# Patient Record
Sex: Male | Born: 1965
Health system: Southern US, Community
[De-identification: ages and names within clinical notes are randomized; demographics above are authoritative.]

## PROBLEM LIST (undated history)

## (undated) DIAGNOSIS — L259 Unspecified contact dermatitis, unspecified cause: Secondary | ICD-10-CM

## (undated) DIAGNOSIS — Z8719 Personal history of other diseases of the digestive system: Secondary | ICD-10-CM

## (undated) DIAGNOSIS — M25569 Pain in unspecified knee: Secondary | ICD-10-CM

## (undated) HISTORY — DX: Unspecified contact dermatitis, unspecified cause: L25.9

## (undated) HISTORY — PX: POLYPECTOMY: SHX149

## (undated) HISTORY — DX: Pain in unspecified knee: M25.569

## (undated) HISTORY — DX: Personal history of other diseases of the digestive system: Z87.19

---

## 1998-06-01 ENCOUNTER — Ambulatory Visit (HOSPITAL_BASED_OUTPATIENT_CLINIC_OR_DEPARTMENT_OTHER): Admission: RE | Admit: 1998-06-01 | Discharge: 1998-06-01 | Payer: Self-pay

## 2004-11-08 ENCOUNTER — Ambulatory Visit: Payer: Self-pay | Admitting: Internal Medicine

## 2004-11-12 ENCOUNTER — Ambulatory Visit: Payer: Self-pay

## 2005-11-17 ENCOUNTER — Ambulatory Visit: Payer: Self-pay | Admitting: Internal Medicine

## 2006-05-30 ENCOUNTER — Ambulatory Visit: Payer: Self-pay | Admitting: Internal Medicine

## 2006-11-21 HISTORY — PX: KNEE ARTHROSCOPY: SHX127

## 2007-01-10 ENCOUNTER — Ambulatory Visit: Payer: Self-pay | Admitting: Internal Medicine

## 2007-02-19 ENCOUNTER — Ambulatory Visit: Payer: Self-pay | Admitting: Internal Medicine

## 2007-11-24 ENCOUNTER — Encounter: Payer: Self-pay | Admitting: *Deleted

## 2007-11-24 DIAGNOSIS — Z87898 Personal history of other specified conditions: Secondary | ICD-10-CM

## 2007-11-24 DIAGNOSIS — L259 Unspecified contact dermatitis, unspecified cause: Secondary | ICD-10-CM

## 2007-11-24 DIAGNOSIS — M25569 Pain in unspecified knee: Secondary | ICD-10-CM

## 2008-06-12 ENCOUNTER — Ambulatory Visit: Payer: Self-pay | Admitting: Internal Medicine

## 2008-06-12 LAB — CONVERTED CEMR LAB
ALT: 47 units/L (ref 0–53)
BUN: 15 mg/dL (ref 6–23)
Basophils Relative: 0.3 % (ref 0.0–3.0)
Bilirubin, Direct: 0.1 mg/dL (ref 0.0–0.3)
CO2: 30 meq/L (ref 19–32)
Calcium: 9.1 mg/dL (ref 8.4–10.5)
Creatinine, Ser: 1.4 mg/dL (ref 0.4–1.5)
Eosinophils Relative: 6.8 % — ABNORMAL HIGH (ref 0.0–5.0)
Glucose, Bld: 107 mg/dL — ABNORMAL HIGH (ref 70–99)
Hemoglobin: 13 g/dL (ref 13.0–17.0)
Leukocytes, UA: NEGATIVE
Lymphocytes Relative: 54.8 % — ABNORMAL HIGH (ref 12.0–46.0)
Monocytes Relative: 8 % (ref 3.0–12.0)
Neutro Abs: 1.3 10*3/uL — ABNORMAL LOW (ref 1.4–7.7)
Nitrite: NEGATIVE
RBC: 5.29 M/uL (ref 4.22–5.81)
Specific Gravity, Urine: 1.025 (ref 1.000–1.03)
TSH: 1.34 microintl units/mL (ref 0.35–5.50)
Total CHOL/HDL Ratio: 3.5
Total Protein: 6.7 g/dL (ref 6.0–8.3)
Triglycerides: 85 mg/dL (ref 0–149)
pH: 6 (ref 5.0–8.0)

## 2008-06-20 ENCOUNTER — Ambulatory Visit: Payer: Self-pay | Admitting: Internal Medicine

## 2008-06-20 LAB — CONVERTED CEMR LAB: PSA: 1.15 ng/mL (ref 0.10–4.00)

## 2008-06-23 ENCOUNTER — Encounter: Payer: Self-pay | Admitting: Internal Medicine

## 2009-04-06 ENCOUNTER — Ambulatory Visit: Payer: Self-pay | Admitting: Internal Medicine

## 2009-04-06 ENCOUNTER — Encounter: Payer: Self-pay | Admitting: Internal Medicine

## 2009-04-06 DIAGNOSIS — S99919A Unspecified injury of unspecified ankle, initial encounter: Secondary | ICD-10-CM

## 2009-04-06 DIAGNOSIS — S99929A Unspecified injury of unspecified foot, initial encounter: Secondary | ICD-10-CM

## 2009-04-06 DIAGNOSIS — S8990XA Unspecified injury of unspecified lower leg, initial encounter: Secondary | ICD-10-CM | POA: Insufficient documentation

## 2009-04-08 ENCOUNTER — Telehealth: Payer: Self-pay | Admitting: Internal Medicine

## 2009-05-13 ENCOUNTER — Telehealth: Payer: Self-pay | Admitting: Internal Medicine

## 2009-12-16 ENCOUNTER — Telehealth: Payer: Self-pay | Admitting: Internal Medicine

## 2009-12-17 ENCOUNTER — Ambulatory Visit: Payer: Self-pay | Admitting: Internal Medicine

## 2010-03-15 ENCOUNTER — Ambulatory Visit: Payer: Self-pay | Admitting: Internal Medicine

## 2010-03-15 DIAGNOSIS — H903 Sensorineural hearing loss, bilateral: Secondary | ICD-10-CM

## 2010-03-15 DIAGNOSIS — N4889 Other specified disorders of penis: Secondary | ICD-10-CM

## 2010-06-10 ENCOUNTER — Ambulatory Visit: Payer: Self-pay | Admitting: Internal Medicine

## 2010-06-10 LAB — CONVERTED CEMR LAB
ALT: 38 units/L (ref 0–53)
AST: 37 units/L (ref 0–37)
Alkaline Phosphatase: 68 units/L (ref 39–117)
Bilirubin, Direct: 0.2 mg/dL (ref 0.0–0.3)
CO2: 31 meq/L (ref 19–32)
Calcium: 9.2 mg/dL (ref 8.4–10.5)
Eosinophils Relative: 6.3 % — ABNORMAL HIGH (ref 0.0–5.0)
Glucose, Bld: 93 mg/dL (ref 70–99)
HCT: 40.2 % (ref 39.0–52.0)
HDL: 41.1 mg/dL (ref >39.00)
Ketones, ur: NEGATIVE mg/dL
Lymphocytes Relative: 53.9 % — ABNORMAL HIGH (ref 12.0–46.0)
Monocytes Relative: 8.5 % (ref 3.0–12.0)
Neutrophils Relative %: 30.7 % — ABNORMAL LOW (ref 43.0–77.0)
Platelets: 224 10*3/uL (ref 150.0–400.0)
Sodium: 141 meq/L (ref 135–145)
Specific Gravity, Urine: 1.025 (ref 1.000–1.030)
Total Bilirubin: 1 mg/dL (ref 0.3–1.2)
Total CHOL/HDL Ratio: 4
Urine Glucose: NEGATIVE mg/dL
VLDL: 12.4 mg/dL (ref 0.0–40.0)
WBC: 4.6 10*3/uL (ref 4.5–10.5)
pH: 6 (ref 5.0–8.0)

## 2010-06-15 ENCOUNTER — Ambulatory Visit: Payer: Self-pay | Admitting: Internal Medicine

## 2010-06-15 LAB — CONVERTED CEMR LAB: Chlamydia, Swab/Urine, PCR: NEGATIVE

## 2010-12-21 NOTE — Assessment & Plan Note (Signed)
Summary: SEMEN IN URINE--STC   Vital Signs:  Patient profile:   45 year old male Height:      71 inches Weight:      203 pounds BMI:     28.42 O2 Sat:      96 % on Room air Temp:     98.0 degrees F oral Pulse rate:   77 / minute BP sitting:   138 / 92  (left arm) Cuff size:   large  Vitals Entered By: Bill Salinas CMA (December 17, 2009 4:49 PM)  O2 Flow:  Room air CC: pt here with c/o semen in his urine/ ab   Primary Care Provider:  Jacques Navy MD  CC:  pt here with c/o semen in his urine/ ab.  History of Present Illness: Patient presents with the c/o semen when he urinates. On questioning him he has had a stain on his shorts. He has had testicular pain. He denies any uretheral burning or discomfort. He has had unprotected intercourse but reports that he has had the same partner for a long time. He denies any fever, chills, back pain, abdominal pain.  Current Medications (verified): 1)  None  Allergies (verified): No Known Drug Allergies  Past History:  Past Medical History: Last updated: 06/20/2008 Hx of DERMATITIS (ICD-692.9) Hx of KNEE PAIN, RIGHT (ICD-719.46)-torn minsicus lateral HEMORRHOIDS, HX OF (ICD-V12.79)    Past Surgical History: Last updated: 06/20/2008 arthroscopy right knee torn lateral miniscus '08 (Dr. 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  Risk Factors: Exercise: yes (06/20/2008)  Risk Factors: Smoking Status: never (11/24/2007)  Review of Systems  The patient denies anorexia, weight loss, hoarseness, chest pain, dyspnea on exertion, headaches, abdominal pain, incontinence, genital  sores, suspicious skin lesions, difficulty walking, abnormal bleeding, and enlarged lymph nodes.    Physical Exam  General:  Very fit appearing AA male who looks younger than his stated age Head:  Normocephalic and atraumatic without obvious abnormalities. No apparent alopecia or balding. Neck:  supple.   Lungs:  Normal respiratory effort, chest expands symmetrically. Lungs are clear to auscultation, no crackles or wheezes. Heart:  normal rate and regular rhythm.   Skin:  turgor normal, color normal, and no rashes.   Psych:  Oriented X3, memory intact for recent and remote, normally interactive, and good eye contact.     Impression & Recommendations:  Problem # 1:  PENILE DISCHARGE (ICD-788.7) Patient with a penile discharge - appears to be semen by his report. He has been having unprotected intercourse. Discussed my concern for possible STD/GC.  Plan azithromycin 2 g single dose.  Complete Medication List: 1)  Azithromycin 500 Mg Tabs (Azithromycin) .... 4 tabs taken as single dose Prescriptions: AZITHROMYCIN 500 MG TABS (AZITHROMYCIN) 4 tabs taken as single dose  #4 x 0   Entered and Authorized by:   Jacques Navy MD   Signed by:   Jacques Navy MD on 12/21/2009   Method used:   Electronically to        [facility]* (retail)       [address]       [place], [place]  [id number]  Ph: [phone]       Fax: [phone]   RxID:   [id number]

## 2010-12-21 NOTE — Assessment & Plan Note (Signed)
Summary: PHYSICAL--STC   Vital Signs:  Patient profile:   45 year old male Height:      71 inches Weight:      198 pounds BMI:     27.72 O2 Sat:      96 % on Room air Temp:     98.5 degrees F oral Pulse rate:   64 / minute BP sitting:   118 / 78  (left arm) Cuff size:   large  Vitals Entered By: Bill Salinas CMA (June 15, 2010 1:23 PM)  O2 Flow:  Room air  Primary Care Provider:  Jacques Navy MD   History of Present Illness: Patient presents for routine medical exam. He is feeling good. he has been healthy. Has diagnosed with flat warts of the face. He is using Terrasil- a non-prescription cream off the internet for treatment of flat warts. Could not determine the active ingredients. He is tolerating this well.   Current Medications (verified): 1)  None  Allergies (verified): No Known Drug Allergies  Past History:  Past Medical History: Last updated: 06/20/2008 Hx of DERMATITIS (ICD-692.9) Hx of KNEE PAIN, RIGHT (ICD-719.46)-torn minsicus lateral HEMORRHOIDS, HX OF (ICD-V12.79)    Past Surgical History: Last updated: 06/20/2008 arthroscopy right knee torn lateral miniscus '08 (Dr. 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  Risk Factors: Smoking Status: never (11/24/2007)  Review of Systems  The patient denies anorexia, fever, weight loss, weight gain, vision loss, decreased hearing, hoarseness, chest pain, syncope, dyspnea on exertion, peripheral edema, prolonged cough, headaches, abdominal pain, melena, hematuria, suspicious skin lesions, transient blindness, difficulty walking, abnormal bleeding, enlarged  lymph nodes, and angioedema.    Physical Exam  General:  Well nourished athletic appearing AA male looking younger than his stated age.  Head:  Normocephalic and atraumatic without obvious abnormalities. No apparent alopecia or balding. Eyes:  No corneal or conjunctival inflammation noted. EOMI. Perrla. Funduscopic exam benign, without hemorrhages, exudates or papilledema. Vision grossly normal. Ears:  External ear exam shows no significant lesions or deformities.  Otoscopic examination reveals clear canals, tympanic membranes are intact bilaterally without bulging, retraction, inflammation or discharge. Hearing is grossly normal bilaterally. Nose:  no external deformity and no external erythema.   Mouth:  Oral mucosa and oropharynx without lesions or exudates.  Teeth in good repair. Neck:  supple, no masses, no thyromegaly, and no carotid bruits.   Chest Wall:  No deformities, masses, tenderness or gynecomastia noted. Lungs:  Normal respiratory effort, chest expands symmetrically. Lungs are clear to auscultation, no crackles or wheezes. Heart:  Normal rate and regular rhythm. S1 and S2 normal without gallop, murmur, click, rub or other extra sounds. Abdomen:  Bowel sounds positive,abdomen soft and non-tender without masses, organomegaly or hernias noted. Rectal:  No external abnormalities noted. Normal sphincter tone. No rectal masses or tenderness. Genitalia:  Testes bilaterally descended without nodularity, tenderness or masses. No scrotal masses or lesions. No penis lesions or urethral discharge. Prostate:  Prostate gland firm and smooth, no enlargement, nodularity, tenderness, mass, asymmetry or induration. Msk:  normal ROM, no joint tenderness, no joint swelling, no joint warmth, no redness over joints, and no joint instability.   Pulses:  2+ radial pulse  Extremities:  No clubbing, cyanosis, edema, or deformity noted with normal full range of motion of all joints.   Neurologic:  alert &  oriented X3, cranial nerves II-XII intact, strength normal in all extremities, gait normal, and DTRs symmetrical and normal.   Skin:  turgor normal, color normal, no suspicious lesions, no purpura, and no ulcerations.  Feint depigmentation forehead. Flat, pale lesions on the cheeks Cervical Nodes:  no anterior cervical adenopathy and no posterior cervical adenopathy.   Axillary Nodes:  no R axillary adenopathy and no L axillary adenopathy.   Psych:  Oriented X3, memory intact for recent and remote, normally interactive, and good eye contact.     Impression & Recommendations:  Problem # 1:  Hx of DERMATITIS (ICD-692.9) treating for flat warts-face with Terrasil - nonprescription medication for warts. Seems to be working   Problem # 2:  HEMORRHOIDS, HX OF (ICD-V12.79) no active hemorrhoids.  Problem # 3:  Preventive Health Care (ICD-V70.0) Normal history. Penile pain with erections has resolved. Normal physical exam. Lab results are all in normal range. He is for general STD screening at his request: GC, chlamydia, syphyllis and HIV. PSA is normal. Immunizations are current.   In summary - a very nice man who works hard and takes the time to invest in his health. He is medically stable. He will return in 18-24 months for routine exam.   Other Orders: T-GC Probe, urine [phone]) T-RPR (Syphilis) [phone]) T-Chlamydia  Probe, urine [phone]) T-HIV Antibody  (Reflex) ([id number])  Patient: Earley Abide Note: All result statuses are Final unless otherwise noted.  Tests: (1) BMP (METABOL)   Sodium                    141 mEq/L                   135-145   Potassium                 4.6 mEq/L                   3.5-5.1   Chloride                  105 mEq/L                   96-112   Carbon Dioxide            31 mEq/L                    19-32   Glucose                   93 mg/dL                    75-10   BUN                       19 mg/dL                    2-58   Creatinine                 1.4 mg/dL                   5.2-7.7   Calcium                   9.2 mg/dL  8.4-10.5   GFR                       73.48 mL/min                >60  Tests: (2) Lipid Panel (LIPID)   Cholesterol               155 mg/dL                   1-610     ATP III Classification            Desirable:  < 200 mg/dL                    Borderline High:  200 - 239 mg/dL               High:  > = 240 mg/dL   Triglycerides             62.0 mg/dL                  9.6-045.4     Normal:  <150 mg/dL     Borderline High:  098 - 199 mg/dL   HDL                       11.91 mg/dL                 >47.82   VLDL Cholesterol          12.4 mg/dL                  9.5-62.1   LDL Cholesterol      [H]  308 mg/dL                   6-57  CHO/HDL Ratio:  CHD Risk                             4                    Men          Women     1/2 Average Risk     3.4          3.3     Average Risk          5.0          4.4     2X Average Risk          9.6          7.1     3X Average Risk          15.0          11.0                           Tests: (3) CBC Platelet w/Diff (CBCD)   White Cell Count          4.6 K/uL                    4.5-10.5   Red Cell Count            5.28 Mil/uL                 4.22-5.81   Hemoglobin  13.0 g/dL                   57.8-46.9   Hematocrit                40.2 %                      39.0-52.0   MCV                  [L]  76.3 fl                     78.0-100.0   MCHC                      32.3 g/dL                   62.9-52.8   RDW                       14.2 %                      11.5-14.6   Platelet Count            224.0 K/uL                  150.0-400.0   Neutrophil %         [L]  30.7 %                      43.0-77.0   Lymphocyte %         [H]  53.9 %                      12.0-46.0   Monocyte %                8.5 %                       3.0-12.0   Eosinophils%         [H]  6.3 %                       0.0-5.0   Basophils %               0.6 %                        0.0-3.0   Neutrophill Absolute      1.4 K/uL                    1.4-7.7   Lymphocyte Absolute       2.5 K/uL                    0.7-4.0   Monocyte Absolute         0.4 K/uL                    0.1-1.0  Eosinophils, Absolute                             0.3 K/uL                    0.0-0.7   Basophils Absolute  0.0 K/uL                    0.0-0.1  Tests: (4) Hepatic/Liver Function Panel (HEPATIC)   Total Bilirubin           1.0 mg/dL                   8.8-4.1   Direct Bilirubin          0.2 mg/dL                   6.6-0.6   Alkaline Phosphatase      68 U/L                      39-117   AST                       37 U/L                      0-37   ALT                       38 U/L                      0-53   Total Protein             7.1 g/dL                    3.0-1.6   Albumin                   4.1 g/dL                    0.1-0.9  Tests: (5) TSH (TSH)   FastTSH                   1.22 uIU/mL                 0.35-5.50  Tests: (6) Prostate Specific Antigen (PSA)   PSA-Hyb                   1.67 ng/mL                  0.10-4.00  Tests: (7) UDip Only (UDIP)   Color                     LT. YELLOW       RANGE:  Yellow;Lt. Yellow   Clarity                   CLEAR                       Clear   Specific Gravity          1.025                       1.000 - 1.030   Urine Ph                  6.0                         5.0-8.0   Protein                   NEGATIVE  Negative   Urine Glucose             NEGATIVE                    Negative   Ketones                   NEGATIVE                    Negative   Urine Bilirubin           NEGATIVE                    Negative   Blood                     NEGATIVE                    Negative   Urobilinogen              1.0                         0.0 - 1.0   Leukocyte Esterace        NEGATIVE                    Negative   Nitrite                   NEGATIVE                    Negative  (1) HIV 1,2 Antibody, with Reflex ([id number])    HIV Antibody              NON REAC                    NON REAC  Tests: (2) RPR Reflex to T.pallidum Ab, Total ([id number])   RPR                       NON REAC                    NON REAC  Tests: (3) Chlamydia Probe Amp,Urine ([id number])  Chlamydia Probe Amp, Ur                             <No Reported Value>         NEGATIVE  Tests: (4) GC Probe Amp, Urine ([id number])   GC Probe Amp, Urine       <No Reported Value>         NEGATIVE

## 2010-12-21 NOTE — Progress Notes (Signed)
Summary: urine issue  Phone Note Call from Patient Call back at Work Phone [phone]   Summary of Call: Patient left message on triage the he would like a call regarding his urine. I spoke with patient and he described himself as "very sexually active".  He is concerned b/c yesterday when the patient tried to urinate semen came out. Patient denies and signs of infection such as sores, bleeding,discharge, and painful/burning urination. Patient also denies penis pain/swelling, but did note that his testicles are more sensitive/a little painful. (Call patient on cell above--he works 3rd shift.) Please adivse. Initial call taken by: Lucious Groves,  December 16, 2009 1:55 PM  Follow-up for Phone Call        Does not sound like a problem that needs any attention as long as there is no urinary retention, hematuria or pain. Follow-up by: Jacques Navy MD,  December 16, 2009 5:35 PM  Additional Follow-up for Phone Call Additional follow up Details #1::        Pt informed, scheduled for office visit today Additional Follow-up by: Lamar Sprinkles, CMA,  December 17, 2009 3:56 PM

## 2010-12-21 NOTE — Assessment & Plan Note (Signed)
Summary: ERECTION PAIN---STC   Vital Signs:  Patient profile:   45 year old male Height:      71 inches Weight:      197 pounds BMI:     27.58 O2 Sat:      97 % on Room air Temp:     98.0 degrees F oral Pulse rate:   58 / minute BP sitting:   122 / 72  (left arm) Cuff size:   large  Vitals Entered By: Bill Salinas CMA (March 15, 2010 9:53 AM)  O2 Flow:  Room air CC: pt here with complaint of pain on erection/ ab   Primary Care Provider:  Jacques Navy MD  CC:  pt here with complaint of pain on erection/ ab.  History of Present Illness: Patient presents c/o a mild discomfort affecting the glans penis with erections. He is able to attain and sustain a normal erection to completion of intercourse. He has not noticed any penile deformity with erection. He has had no uretheral d/c, pain with intercourse or with ejactulation, no perineal, low back or groin pain.  He has had high frequency hearing loss as tested at work. He would like an ear exam today.   Current Medications (verified): 1)  None  Allergies (verified): No Known Drug Allergies  Past History:  Past Medical History: Last updated: 06/20/2008 Hx of DERMATITIS (ICD-692.9) Hx of KNEE PAIN, RIGHT (ICD-719.46)-torn minsicus lateral HEMORRHOIDS, HX OF (ICD-V12.79)    Past Surgical History: Last updated: 06/20/2008 arthroscopy right knee torn lateral miniscus '08 (Dr. Thurston Hole) [facility] reviewed for relevance, FH reviewed for relevance  Review of Systems  The patient denies anorexia, fever, weight loss, weight gain, decreased hearing, chest pain, syncope, dyspnea on exertion, prolonged cough, hemoptysis, abdominal pain, hematochezia, hematuria, genital sores, difficulty walking, enlarged lymph nodes, and testicular masses.    Physical Exam  General:  WNWD AA male - appears atheletic Ears:  External ear exam shows no significant lesions or deformities.  Otoscopic examination reveals clear canals, tympanic membranes  are intact bilaterally without bulging, retraction, inflammation or discharge. Hearing is grossly normal bilaterally. Genitalia:  circumcised, no hydrocele, no varicocele, and no scrotal masses.  No abanormality of the glans penis, urethra or penile shaft.  Psych:  Oriented X3, normally interactive, and good eye contact.     Impression & Recommendations:  Problem # 1:  PENILE PAIN (ICD-607.89) Patient with mild discomfort which he describes as a tightness at the glans penis with erection but not frank pain. this will abate with intercourse. History is otherwise unremarkable.  Impression - normal exam.May have very symptoms of Peyronie's disease.  Plan - provided "UpToDate" chapt on Peyronie's disease           call if symptoms get worse.  Problem # 2:  SENSORINEURAL HEARING LOSS BILATERAL (ICD-389.18) EACs and TMs normal. He has high frequency hearing loss by audiometry at work.  Plan - he may want to get repeat audiology evaluation and consultation from ENT.

## 2011-04-08 NOTE — Letter (Signed)
January 10, 2007    Dr. Pati Gallo  [facility]  [address]  [place], [place]. [address]   RE:  William Jacobson, William Jacobson  MRN:  [id number]  /  DOB:  12/19/1965   Dear Dr. Farris Has,   Thank you very much for seeing William Jacobson, a very pleasant 45-  year-old African-American gentleman, who has a problem with stiffness in  his right knee.   William Jacobson has had problems with stiffness in his right knee and  decreased range of motion, particularly in regards to flexion, for at  least two years.  In December of 2005, I did see him for this problem.  I obtained lower extremity venous Dopplers, which were negative for  Baker's cyst or any other vascular abnormality.  The patient continues  to have a problem with some stiffness in his knee, mild discomfort.  He  has limitation in flexion, but really no limitation in his daily  activities.   On examination, the knee appears normal to me.  There is no crepitus.  There is no lock or click.  He has a negative drawer sign.   I do not have any radiographic studies on this patient.   PAST MEDICAL HISTORY:  Significant for the usual childhood diseases,  hemorrhoids, knee pain and no other medical problems.  He takes no  prescription medications at this time.   I appreciate your assistance in evaluating this nice gentleman.  If I  can provide any additional information, please do not hesitate to  contact me.   I look forward to hearing from you and I remain    Sincerely,      William Gess. Norins, MD  Electronically Signed    MEN/MedQ  DD: 01/10/2007  DT: 01/10/2007  Job #: [id number]

## 2011-04-08 NOTE — Assessment & Plan Note (Signed)
[facility]                           PRIMARY CARE OFFICE NOTE   William Jacobson, William Jacobson                   MRN:          [id number]  DATE:02/19/2007                            DOB:          05/16/1966    Mr. William Jacobson presents today for removal of a small skin tag from the  suprapubic region.  After informed verbal and written consent was  obtained the area was prepped with alcohol, anesthetized with Lidocaine  with 1% epinephrine.  The area was re-prepped with Betadine.   Using sharp curved scissors the skin tag was removed without difficulty.  The resulting wound was hyfrecated with a setting of 10 with good  control of bleeding.   Skin tag removal, the patient tolerated this well.  He was given routine  wound precautions.  He will return to see me on a p.r.n. basis.     Rosalyn Gess Norins, MD  Electronically Signed    MEN/MedQ  DD: 02/20/2007  DT: 02/20/2007  Job #: [id number]

## 2011-07-15 ENCOUNTER — Other Ambulatory Visit: Payer: Self-pay

## 2011-07-22 ENCOUNTER — Encounter: Payer: Self-pay | Admitting: Internal Medicine

## 2012-04-03 ENCOUNTER — Telehealth: Payer: Self-pay | Admitting: *Deleted

## 2012-04-03 NOTE — Telephone Encounter (Signed)
NOTIFIED [employer] of need for patient to have ov with Dr. Debby Bud before completing form for Rx. Forms faxed back to [employer] as requested.  sue

## 2012-05-14 ENCOUNTER — Telehealth: Payer: Self-pay | Admitting: Internal Medicine

## 2012-05-14 NOTE — Telephone Encounter (Signed)
The patient called and is concerned he has a sexually transmitted disease.  He is hoping to get worked in with you this week. Do you want him added?    Thanks!

## 2012-05-14 NOTE — Telephone Encounter (Signed)
OK to add on ASAP

## 2012-05-15 ENCOUNTER — Encounter: Payer: Self-pay | Admitting: Internal Medicine

## 2012-05-15 ENCOUNTER — Ambulatory Visit (INDEPENDENT_AMBULATORY_CARE_PROVIDER_SITE_OTHER): Payer: BC Managed Care – PPO | Admitting: Internal Medicine

## 2012-05-15 VITALS — BP 120/72 | HR 61 | Temp 97.7°F | Resp 16 | Wt 202.0 lb

## 2012-05-15 DIAGNOSIS — Z202 Contact with and (suspected) exposure to infections with a predominantly sexual mode of transmission: Secondary | ICD-10-CM

## 2012-05-15 DIAGNOSIS — Z2089 Contact with and (suspected) exposure to other communicable diseases: Secondary | ICD-10-CM

## 2012-05-15 MED ORDER — METRONIDAZOLE 500 MG PO TABS: 500.0000 mg | ORAL_TABLET | Freq: Three times a day (TID) | ORAL | Status: AC

## 2012-05-15 NOTE — Patient Instructions (Addendum)
Sounds like trichomonas vaginalis - a common infection that is sexually transmitted usually between partners in a cycle of reinfection. Men are generally asymptomatic.  Plan - metronidazole 500 mg 4 tablets at one time and done.   Trichomoniasis Trichomoniasis is an infection, caused by the Trichomonas organism, that affects both women and men. In women, the outer male genitalia and the vagina are affected. In men, the penis is mainly affected, but the prostate and other reproductive organs can also be involved. Trichomoniasis is a sexually transmitted disease (STD) and is most often passed to another person through sexual contact. The majority of people who get trichomoniasis do so from a sexual encounter and are also at risk for other STDs. CAUSES    Sexual intercourse with an infected partner.   It can be present in swimming pools or hot tubs.  SYMPTOMS    Abnormal gray-green frothy vaginal discharge in women.   Vaginal itching and irritation in women.   Itching and irritation of the area outside the vagina in women.   Penile discharge with or without pain in males.   Inflammation of the urethra (urethritis), causing painful urination.   Bleeding after sexual intercourse.  RELATED COMPLICATIONS  Pelvic inflammatory disease.   Infection of the uterus (endometritis).   Infertility.   Tubal (ectopic) pregnancy.   It can be associated with other STDs, including gonorrhea and chlamydia, hepatitis B, and HIV.  COMPLICATIONS DURING PREGNANCY  Early (premature) delivery.   Premature rupture of the membranes (PROM).   Low birth weight.  DIAGNOSIS    Visualization of Trichomonas under the microscope from the vagina discharge.   Ph of the vagina greater than 4.5, tested with a test tape.   Trich Rapid Test.   Culture of the organism, but this is not usually needed.   It may be found on a Pap test.   Having a "strawberry cervix,"which means the cervix looks very red  like a strawberry.  TREATMENT    You may be given medication to fight the infection. Inform your caregiver if you could be or are pregnant. Some medications used to treat the infection should not be taken during pregnancy.   Over-the-counter medications or creams to decrease itching or irritation may be recommended.   Your sexual partner will need to be treated if infected.  HOME CARE INSTRUCTIONS    Take all medication prescribed by your caregiver.   Take over-the-counter medication for itching or irritation as directed by your caregiver.   Do not have sexual intercourse while you have the infection.   Do not douche or wear tampons.   Discuss your infection with your partner, as your partner may have acquired the infection from you. Or, your partner may have been the person who transmitted the infection to you.   Have your sex partner examined and treated if necessary.   Practice safe, informed, and protected sex.   See your caregiver for other STD testing.  SEEK MEDICAL CARE IF:    You still have symptoms after you finish the medication.   You have an oral temperature above 102 F (38.9 C).   You develop belly (abdominal) pain.   You have pain when you urinate.   You have bleeding after sexual intercourse.   You develop a rash.   The medication makes you sick or makes you throw up (vomit).  Document Released: 05/03/2001 Document Revised: 10/27/2011 Document Reviewed: 05/29/2009 [facility] Patient Information 2012 [place], [place].

## 2012-05-15 NOTE — Telephone Encounter (Signed)
lvmom on home & cell number.

## 2012-05-15 NOTE — Progress Notes (Signed)
  Subjective:    Patient ID: William Jacobson, male    DOB: 18-Apr-1966, 46 y.o.   MRN: [id number]  HPI William Jacobson presents due to his partner being diagnosed with what sound like trichmonas. He is asymptomatic. He reports that he is monogamous  He has no active medical complaints. He takes good care of himself physically.  Past Medical History  Diagnosis Date  . Contact dermatitis and other eczema, due to unspecified cause   . Pain in joint, lower leg     pain in right knee- torn minisicus lateral  . Personal history of other diseases of digestive system     history of hemorrhoids   Past Surgical History  Procedure Date  . Knee arthroscopy 2008    arthroscopy right knee torn laterlal  miniscus   Family History  Problem Relation Age of Onset  . Diabetes Mother    History   Social History  . Marital Status: Married    Spouse Name: N/A    Number of Children: N/A  . Years of Education: N/A   Occupational History  . Not on file.   Social History Main Topics  . Smoking status: Not on file  . Smokeless tobacco: Not on file  . Alcohol Use:   . Drug Use:   . Sexually Active:    Other Topics Concern  . 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     Objective:   Physical Exam Filed Vitals:   05/15/12 1144  BP: 120/72  Pulse: 61  Temp: 97.7 F (36.5 C)  Resp: 16   Gen'l- very athletic appearing AA man in no distress Cor- 2+ radial Pulm - normal  Neuro - A&O x 3       Assessment & Plan:  Trichomonas - based on history he relates.  Plan - Metronidazole 2 g x 1 dose  Health maintenance -  recommend CPX 2014

## 2015-06-12 ENCOUNTER — Telehealth: Payer: Self-pay | Admitting: General Practice

## 2015-06-12 NOTE — Telephone Encounter (Signed)
Patient is a previous [facility] patient.  He is requesting to transfer to [place].  His new wife will need a provider as well and he is requesting for her to establish as well.  Please advise.

## 2015-06-14 NOTE — Telephone Encounter (Signed)
Ok with me 

## 2015-06-15 NOTE — Telephone Encounter (Signed)
Notified patient.  Patient will call back to schedule.

## 2015-08-06 ENCOUNTER — Ambulatory Visit (INDEPENDENT_AMBULATORY_CARE_PROVIDER_SITE_OTHER): Payer: BLUE CROSS/BLUE SHIELD | Admitting: Family

## 2015-08-06 ENCOUNTER — Encounter: Payer: Self-pay | Admitting: Family

## 2015-08-06 VITALS — BP 112/86 | HR 76 | Temp 98.6°F | Resp 18 | Ht 71.0 in | Wt 204.0 lb

## 2015-08-06 DIAGNOSIS — Z0000 Encounter for general adult medical examination without abnormal findings: Secondary | ICD-10-CM

## 2015-08-06 DIAGNOSIS — Z0001 Encounter for general adult medical examination with abnormal findings: Secondary | ICD-10-CM

## 2015-08-06 MED ORDER — SELENIUM SULFIDE 2.5 % EX LOTN: 1.0000 "application " | TOPICAL_LOTION | Freq: Every day | CUTANEOUS | Status: DC | PRN

## 2015-08-06 NOTE — Progress Notes (Signed)
Pre visit review using our clinic review tool, if applicable. No additional management support is needed unless otherwise documented below in the visit note. 

## 2015-08-06 NOTE — Patient Instructions (Signed)
Thank you for choosing [facility].  Summary/Instructions:  Your prescription(s) have been submitted to your pharmacy or been printed and provided for you. Please take as directed and contact our office if you believe you are having problem(s) with the medication(s) or have any questions.  Please stop by the lab on the basement level of the building for your blood work. Your results will be released to MyChart (or called to you) after review, usually within 72 hours after test completion. If any changes need to be made, you will be notified at that same time.   Health Maintenance A healthy lifestyle and preventative care can promote health and wellness.  Maintain regular health, dental, and eye exams.  Eat a healthy diet. Foods like vegetables, fruits, whole grains, low-fat dairy products, and lean protein foods contain the nutrients you need and are low in calories. Decrease your intake of foods high in solid fats, added sugars, and salt. Get information about a proper diet from your health care provider, if necessary.  Regular physical exercise is one of the most important things you can do for your health. Most adults should get at least 150 minutes of moderate-intensity exercise (any activity that increases your heart rate and causes you to sweat) each week. In addition, most adults need muscle-strengthening exercises on 2 or more days a week.   Maintain a healthy weight. The body mass index (BMI) is a screening tool to identify possible weight problems. It provides an estimate of body fat based on height and weight. Your health care provider can find your BMI and can help you achieve or maintain a healthy weight. For males 20 years and older:  A BMI below 18.5 is considered underweight.  A BMI of 18.5 to 24.9 is normal.  A BMI of 25 to 29.9 is considered overweight.  A BMI of 30 and above is considered obese.  Maintain normal blood lipids and cholesterol by exercising and  minimizing your intake of saturated fat. Eat a balanced diet with plenty of fruits and vegetables. Blood tests for lipids and cholesterol should begin at age 20 and be repeated every 5 years. If your lipid or cholesterol levels are high, you are over age 50, or you are at high risk for heart disease, you may need your cholesterol levels checked more frequently.Ongoing high lipid and cholesterol levels should be treated with medicines if diet and exercise are not working.  If you smoke, find out from your health care provider how to quit. If you do not use tobacco, do not start.  Lung cancer screening is recommended for adults aged 55-80 years who are at high risk for developing lung cancer because of a history of smoking. A yearly low-dose CT scan of the lungs is recommended for people who have at least a 30-pack-year history of smoking and are current smokers or have quit within the past 15 years. A pack year of smoking is smoking an average of 1 pack of cigarettes a day for 1 year (for example, a 30-pack-year history of smoking could mean smoking 1 pack a day for 30 years or 2 packs a day for 15 years). Yearly screening should continue until the smoker has stopped smoking for at least 15 years. Yearly screening should be stopped for people who develop a health problem that would prevent them from having lung cancer treatment.  If you choose to drink alcohol, do not have more than 2 drinks per day. One drink is considered to be   12 oz (360 mL) of beer, 5 oz (150 mL) of wine, or 1.5 oz (45 mL) of liquor.  Avoid the use of street drugs. Do not share needles with anyone. Ask for help if you need support or instructions about stopping the use of drugs.  High blood pressure causes heart disease and increases the risk of stroke. Blood pressure should be checked at least every 1-2 years. Ongoing high blood pressure should be treated with medicines if weight loss and exercise are not effective.  If you are  45-79 years old, ask your health care provider if you should take aspirin to prevent heart disease.  Diabetes screening involves taking a blood sample to check your fasting blood sugar level. This should be done once every 3 years after age 45 if you are at a normal weight and without risk factors for diabetes. Testing should be considered at a younger age or be carried out more frequently if you are overweight and have at least 1 risk factor for diabetes.  Colorectal cancer can be detected and often prevented. Most routine colorectal cancer screening begins at the age of 50 and continues through age 75. However, your health care provider may recommend screening at an earlier age if you have risk factors for colon cancer. On a yearly basis, your health care provider may provide home test kits to check for hidden blood in the stool. A small camera at the end of a tube may be used to directly examine the colon (sigmoidoscopy or colonoscopy) to detect the earliest forms of colorectal cancer. Talk to your health care provider about this at age 50 when routine screening begins. A direct exam of the colon should be repeated every 5-10 years through age 75, unless early forms of precancerous polyps or small growths are found.  People who are at an increased risk for hepatitis B should be screened for this virus. You are considered at high risk for hepatitis B if:  You were born in a country where hepatitis B occurs often. Talk with your health care provider about which countries are considered high risk.  Your parents were born in a high-risk country and you have not received a shot to protect against hepatitis B (hepatitis B vaccine).  You have HIV or AIDS.  You use needles to inject street drugs.  You live with, or have sex with, someone who has hepatitis B.  You are a man who has sex with other men (MSM).  You get hemodialysis treatment.  You take certain medicines for conditions like cancer, organ  transplantation, and autoimmune conditions.  Hepatitis C blood testing is recommended for all people born from 1945 through 1965 and any individual with known risk factors for hepatitis C.  Healthy men should no longer receive prostate-specific antigen (PSA) blood tests as part of routine cancer screening. Talk to your health care provider about prostate cancer screening.  Testicular cancer screening is not recommended for adolescents or adult males who have no symptoms. Screening includes self-exam, a health care provider exam, and other screening tests. Consult with your health care provider about any symptoms you have or any concerns you have about testicular cancer.  Practice safe sex. Use condoms and avoid high-risk sexual practices to reduce the spread of sexually transmitted infections (STIs).  You should be screened for STIs, including gonorrhea and chlamydia if:  You are sexually active and are younger than 24 years.  You are older than 24 years, and your health care provider   tells you that you are at risk for this type of infection.  Your sexual activity has changed since you were last screened, and you are at an increased risk for chlamydia or gonorrhea. Ask your health care provider if you are at risk.  If you are at risk of being infected with HIV, it is recommended that you take a prescription medicine daily to prevent HIV infection. This is called pre-exposure prophylaxis (PrEP). You are considered at risk if:  You are a man who has sex with other men (MSM).  You are a heterosexual man who is sexually active with multiple partners.  You take drugs by injection.  You are sexually active with a partner who has HIV.  Talk with your health care provider about whether you are at high risk of being infected with HIV. If you choose to begin PrEP, you should first be tested for HIV. You should then be tested every 3 months for as long as you are taking PrEP.  Use sunscreen. Apply  sunscreen liberally and repeatedly throughout the day. You should seek shade when your shadow is shorter than you. Protect yourself by wearing long sleeves, pants, a wide-brimmed hat, and sunglasses year round whenever you are outdoors.  Tell your health care provider of new moles or changes in moles, especially if there is a change in shape or color. Also, tell your health care provider if a mole is larger than the size of a pencil eraser.  A one-time screening for abdominal aortic aneurysm (AAA) and surgical repair of large AAAs by ultrasound is recommended for men aged 65-75 years who are current or former smokers.  Stay current with your vaccines (immunizations). Document Released: 05/05/2008 Document Revised: 11/12/2013 Document Reviewed: 04/04/2011 ExitCare Patient Information 2015 [employer]. This information is not intended to replace advice given to you by your health care provider. Make sure you discuss any questions you have with your health care provider.   

## 2015-08-06 NOTE — Progress Notes (Signed)
Subjective:    Patient ID: William Jacobson, male    DOB: 03-10-1966, 49 y.o.   MRN: [id number]  Chief Complaint  Patient presents with  . Establish Care    CPE, not fasting    HPI:  William Jacobson is a 49 y.o. male who presents today for an annual wellness visit.   1) Health Maintenance -   Diet - Averages about 2-3 meals per day consisting of chicken, vegetables, fish, fruit, dairy; 1 cup of caffeine daily  Exercise - 3-4 days per week at the gym mixture of cardio/resistance   2) Preventative Exams / Immunizations:  Dental -- Up to date  Vision -- Due exam   Health Maintenance  Topic Date Due  . COLONOSCOPY  05/24/2015  . INFLUENZA VACCINE  08/05/2016 (Originally 06/22/2015)  . TETANUS/TDAP  01/19/2025  . HIV Screening  Completed     There is no immunization history on file for this patient.   Review of Systems  Constitutional: Denies fever, chills, fatigue, or significant weight gain/loss. HENT: Head: Denies headache or neck pain Ears: Denies changes in hearing, ringing in ears, earache, drainage Nose: Denies discharge, stuffiness, itching, nosebleed, sinus pain Throat: Denies sore throat, hoarseness, dry mouth, sores, thrush Eyes: Denies loss/changes in vision, pain, redness, blurry/double vision, flashing lights Cardiovascular: Denies chest pain/discomfort, tightness, palpitations, shortness of breath with activity, difficulty lying down, swelling, sudden awakening with shortness of breath Respiratory: Denies shortness of breath, cough, sputum production, wheezing Gastrointestinal: Denies dysphasia, heartburn, change in appetite, nausea, change in bowel habits, rectal bleeding, constipation, diarrhea, yellow skin or eyes Genitourinary: Denies frequency, urgency, burning/pain, blood in urine, incontinence, change in urinary strength. Musculoskeletal: Denies muscle/joint pain, stiffness, back pain, redness or swelling of joints, trauma Skin: Denies  rashes, lumps, itching, dryness, color changes, or hair/nail changes Neurological: Denies dizziness, fainting, seizures, weakness, numbness, tingling, tremor Psychiatric - Denies nervousness, stress, depression or memory loss Endocrine: Denies heat or cold intolerance, sweating, frequent urination, excessive thirst, changes in appetite Hematologic: Denies ease of bruising or bleeding     Objective:    BP 112/86 mmHg  Pulse 76  Temp(Src) 98.6 F (37 C) (Oral)  Resp 18  Ht 5\' 11"  (1.803 m)  Wt 204 lb (92.534 kg)  BMI 28.46 kg/m2  SpO2 97% Nursing note and vital signs reviewed.  Physical Exam  Constitutional: He is oriented to person, place, and time. He appears well-developed and well-nourished.  HENT:  Head: Normocephalic.  Right Ear: Hearing, tympanic membrane, external ear and ear canal normal.  Left Ear: Hearing, tympanic membrane, external ear and ear canal normal.  Nose: Nose normal.  Mouth/Throat: Uvula is midline, oropharynx is clear and moist and mucous membranes are normal.  Eyes: Conjunctivae and EOM are normal. Pupils are equal, round, and reactive to light.  Neck: Neck supple. No JVD present. No tracheal deviation present. No thyromegaly present.  Cardiovascular: Normal rate, regular rhythm, normal heart sounds and intact distal pulses.   Pulmonary/Chest: Effort normal and breath sounds normal.  Abdominal: Soft. Bowel sounds are normal. He exhibits no distension and no mass. There is no tenderness. There is no rebound and no guarding.  Musculoskeletal: Normal range of motion. He exhibits no edema or tenderness.  Lymphadenopathy:    He has no cervical adenopathy.  Neurological: He is alert and oriented to person, place, and time. He has normal reflexes. No cranial nerve deficit. He exhibits normal muscle tone. Coordination normal.  Skin: Skin is warm and dry.  Skin with sporadic circles and confluent circles of discoloration noted on the top of his head  Psychiatric:  He has a normal mood and affect. His behavior is normal. Judgment and thought content normal.       Assessment & Plan:   Problem List Items Addressed This Visit      Other   Routine general medical examination at a health care facility - Primary    1) Anticipatory Guidance: Discussed importance of wearing a seatbelt while driving and not texting while driving; changing batteries in smoke detector at least once annually; wearing suntan lotion when outside; eating a balanced and moderate diet; getting physical activity at least 30 minutes per day.  2) Immunizations / Screenings / Labs:  Declined flu shot. All other immunizations are up to date per recommendations. Due for a vision exam and colonoscopy. Vision will be scheduled independently and referral to gastroenterology has been placed. All other screenings are up to date per recommendations. Obtain CBC, BMET, Lipid profile, PSA and TSH.   Overall well exam. Minimal risk factors for cardiovascular disease noted. Continue current nutrition and exercise regimen. Skin exam reveals what appears as tinea versicolor. Start [facility]. Continue current healthy behaviors and choices. Follow up prevention exam in 1 year. Follow up office visit pending lab work.        Relevant Orders   CBC   Comprehensive metabolic panel   Lipid panel   TSH   PSA   Ambulatory referral to Gastroenterology

## 2015-08-06 NOTE — Assessment & Plan Note (Signed)
1) Anticipatory Guidance: Discussed importance of wearing a seatbelt while driving and not texting while driving; changing batteries in smoke detector at least once annually; wearing suntan lotion when outside; eating a balanced and moderate diet; getting physical activity at least 30 minutes per day.  2) Immunizations / Screenings / Labs:  Declined flu shot. All other immunizations are up to date per recommendations. Due for a vision exam and colonoscopy. Vision will be scheduled independently and referral to gastroenterology has been placed. All other screenings are up to date per recommendations. Obtain CBC, BMET, Lipid profile, PSA and TSH.   Overall well exam. Minimal risk factors for cardiovascular disease noted. Continue current nutrition and exercise regimen. Skin exam reveals what appears as tinea versicolor. Start [facility]. Continue current healthy behaviors and choices. Follow up prevention exam in 1 year. Follow up office visit pending lab work.

## 2015-11-02 ENCOUNTER — Encounter: Payer: Self-pay | Admitting: Gastroenterology

## 2015-12-02 ENCOUNTER — Ambulatory Visit (AMBULATORY_SURGERY_CENTER): Payer: Self-pay | Admitting: *Deleted

## 2015-12-02 VITALS — Ht 71.0 in | Wt 210.0 lb

## 2015-12-02 DIAGNOSIS — Z1211 Encounter for screening for malignant neoplasm of colon: Secondary | ICD-10-CM

## 2015-12-02 MED ORDER — NA SULFATE-K SULFATE-MG SULF 17.5-3.13-1.6 GM/177ML PO SOLN: 1.0000 | Freq: Once | ORAL | Status: DC

## 2015-12-02 NOTE — Progress Notes (Signed)
No egg or soy allergy. No anesthesia problems.  No home O2.  No diet meds.  

## 2015-12-14 ENCOUNTER — Encounter: Payer: BLUE CROSS/BLUE SHIELD | Admitting: Gastroenterology

## 2016-01-01 ENCOUNTER — Ambulatory Visit (AMBULATORY_SURGERY_CENTER): Payer: BLUE CROSS/BLUE SHIELD | Admitting: Gastroenterology

## 2016-01-01 ENCOUNTER — Encounter: Payer: Self-pay | Admitting: Gastroenterology

## 2016-01-01 VITALS — BP 149/87 | HR 59 | Temp 98.2°F | Resp 16 | Ht 71.0 in | Wt 210.0 lb

## 2016-01-01 DIAGNOSIS — D123 Benign neoplasm of transverse colon: Secondary | ICD-10-CM

## 2016-01-01 DIAGNOSIS — D122 Benign neoplasm of ascending colon: Secondary | ICD-10-CM | POA: Diagnosis not present

## 2016-01-01 DIAGNOSIS — Z1211 Encounter for screening for malignant neoplasm of colon: Secondary | ICD-10-CM

## 2016-01-01 HISTORY — PX: COLONOSCOPY: SHX174

## 2016-01-01 MED ORDER — SODIUM CHLORIDE 0.9 % IV SOLN: 500.0000 mL | INTRAVENOUS | Status: DC

## 2016-01-01 NOTE — Op Note (Signed)
[place]  [address] [place], [id number]   COLONOSCOPY PROCEDURE REPORT  PATIENT: William Jacobson, William Jacobson  MR#: [id number] BIRTHDATE: [birth year] , 49  yrs. old GENDER: male ENDOSCOPIST: Yetta Flock, MD REFERRED BY: Mauricio Po NP PROCEDURE DATE:  01/01/2016 PROCEDURE:   Colonoscopy, screening and Colonoscopy with snare polypectomy First Screening Colonoscopy - Avg.  risk and is 50 yrs.  old or older Yes.  Prior Negative Screening - Now for repeat screening. N/A  History of Adenoma - Now for follow-up colonoscopy & has been > or = to 3 yrs.  N/A  Polyps removed today? Yes ASA CLASS:   Class I INDICATIONS:Screening for colonic neoplasia and Colorectal Neoplasm Risk Assessment for this procedure is average risk. MEDICATIONS: Propofol 300 mg IV  DESCRIPTION OF PROCEDURE:   After the risks benefits and alternatives of the procedure were thoroughly explained, informed consent was obtained.  The digital rectal exam revealed no abnormalities of the rectum.   The [id number]  endoscope was introduced through the anus and advanced to the cecum, which was identified by both the appendix and ileocecal valve. No adverse events experienced.   The quality of the prep was adequate  The instrument was then slowly withdrawn as the colon was fully examined. Estimated blood loss is zero unless otherwise noted in this procedure report.   COLON FINDINGS: Two sessile ascending colon polyps roughly 3-61mm in size were noted and removed via cold snare.  A lobulated and semipedunculated polyp was noted in the ascending colon, roughly 2cm in size and was removed in one piece via hot snare.  A prophylactic hemostasis clip was placed to close the defect and prevent bleeding. The polyp had to be cut into multiple pieces to fit through the endoscope. A 42mm sessile polyp was noted in the splenic flexure and removed with cold snare.  The remainder of the examined colon  was normal.  Retroflexed views revealed internal hemorrhoids. The time to cecum = 2.0 Withdrawal time = 23.6   The scope was withdrawn and the procedure completed. COMPLICATIONS: There were no immediate complications.  ENDOSCOPIC IMPRESSION: 4 colon polyps, largest upwards of 2cm in size. Prophylactic hemostasis clip placed Internal hemorrhoids  RECOMMENDATIONS: Await pathology results Resume diet No NSAIDS for 2 weeks, use tylenol PRN for pain Resume medications  eSigned:  Yetta Flock, MD 01/01/2016 2:06 PM   cc:  Mauricio Po NP, the patient

## 2016-01-01 NOTE — Patient Instructions (Signed)
NO NSAIDS (ADVIL, MOTRIN, IBUPROFEN, ALEVE, ETC.) FOR TWO WEEKS, FEBUARY 24, 2017.  HANDOUTS GIVEN: POLYPS, INTERNAL HEMORROIDS.     YOU HAD AN ENDOSCOPIC PROCEDURE TODAY AT [facility]:   Refer to the procedure report that was given to you for any specific questions about what was found during the examination.  If the procedure report does not answer your questions, please call your gastroenterologist to clarify.  If you requested that your care partner not be given the details of your procedure findings, then the procedure report has been included in a sealed envelope for you to review at your convenience later.  YOU SHOULD EXPECT: Some feelings of bloating in the abdomen. Passage of more gas than usual.  Walking can help get rid of the air that was put into your GI tract during the procedure and reduce the bloating. If you had a lower endoscopy (such as a colonoscopy or flexible sigmoidoscopy) you may notice spotting of blood in your stool or on the toilet paper. If you underwent a bowel prep for your procedure, you may not have a normal bowel movement for a few days.  Please Note:  You might notice some irritation and congestion in your nose or some drainage.  This is from the oxygen used during your procedure.  There is no need for concern and it should clear up in a day or so.  SYMPTOMS TO REPORT IMMEDIATELY:   Following lower endoscopy (colonoscopy or flexible sigmoidoscopy):  Excessive amounts of blood in the stool  Significant tenderness or worsening of abdominal pains  Swelling of the abdomen that is new, acute  Fever of 100F or higher    For urgent or emergent issues, a gastroenterologist can be reached at any hour by calling [phone].   DIET: Your first meal following the procedure should be a small meal and then it is ok to progress to your normal diet. Heavy or fried foods are harder to digest and may make you feel nauseous or bloated.  Likewise, meals  heavy in dairy and vegetables can increase bloating.  Drink plenty of fluids but you should avoid alcoholic beverages for 24 hours.  ACTIVITY:  You should plan to take it easy for the rest of today and you should NOT DRIVE or use heavy machinery until tomorrow (because of the sedation medicines used during the test).    FOLLOW UP: Our staff will call the number listed on your records the next business day following your procedure to check on you and address any questions or concerns that you may have regarding the information given to you following your procedure. If we do not reach you, we will leave a message.  However, if you are feeling well and you are not experiencing any problems, there is no need to return our call.  We will assume that you have returned to your regular daily activities without incident.  If any biopsies were taken you will be contacted by phone or by letter within the next 1-3 weeks.  Please call us at [phone] if you have not heard about the biopsies in 3 weeks.    SIGNATURES/CONFIDENTIALITY: You and/or your care partner have signed paperwork which will be entered into your electronic medical record.  These signatures attest to the fact that that the information above on your After Visit Summary has been reviewed and is understood.  Full responsibility of the confidentiality of this discharge information lies with you and/or your care-partner.

## 2016-01-01 NOTE — Progress Notes (Signed)
Called to room to assist during endoscopic procedure.  Patient ID and intended procedure confirmed with present staff. Received instructions for my participation in the procedure from the performing physician.  

## 2016-01-01 NOTE — Progress Notes (Signed)
To recovery, report to Tyrell, RN, VSS. 

## 2016-01-04 ENCOUNTER — Telehealth: Payer: Self-pay

## 2016-01-04 NOTE — Telephone Encounter (Signed)
  Follow up Call-  Call back number 01/01/2016  Post procedure Call Back phone  # [phone]  Permission to leave phone message Yes     Patient was called for follow up after procedure on 01/01/2016. Several attempts were made and the line was busy, so I was not able to leave a message.

## 2016-01-08 ENCOUNTER — Encounter: Payer: Self-pay | Admitting: Family

## 2016-01-08 ENCOUNTER — Ambulatory Visit (INDEPENDENT_AMBULATORY_CARE_PROVIDER_SITE_OTHER): Payer: BLUE CROSS/BLUE SHIELD | Admitting: Family

## 2016-01-08 VITALS — BP 128/80 | HR 65 | Temp 98.3°F | Resp 18 | Ht 71.0 in | Wt 204.0 lb

## 2016-01-08 DIAGNOSIS — R21 Rash and other nonspecific skin eruption: Secondary | ICD-10-CM | POA: Insufficient documentation

## 2016-01-08 MED ORDER — TRIAMCINOLONE ACETONIDE 0.5 % EX CREA: 1.0000 "application " | TOPICAL_CREAM | Freq: Three times a day (TID) | CUTANEOUS | Status: DC

## 2016-01-08 MED ORDER — FLUCONAZOLE 150 MG PO TABS: 150.0000 mg | ORAL_TABLET | Freq: Once | ORAL | Status: DC

## 2016-01-08 NOTE — Progress Notes (Signed)
Pre visit review using our clinic review tool, if applicable. No additional management support is needed unless otherwise documented below in the visit note. 

## 2016-01-08 NOTE — Patient Instructions (Signed)
Thank you for choosing [facility].  Summary/Instructions:  If your symptoms worsen or fail to improve, please contact our office for further instruction, or in case of emergency go directly to the emergency room at the closest medical facility.   Candida Infection, Adult A Candida infection (also called yeast, fungus, and Monilia infection) is an overgrowth of yeast that can occur anywhere on the body. A yeast infection commonly occurs in warm, moist body areas. Usually, the infection remains localized but can spread to become a systemic infection. A yeast infection may be a sign of a more severe disease such as diabetes, leukemia, or AIDS. A yeast infection can occur in both men and women. In women, Candida vaginitis is a vaginal infection. It is one of the most common causes of vaginitis. Men usually do not have symptoms or know they have an infection until other problems develop. Men may find out they have a yeast infection because their sex partner has a yeast infection. Uncircumcised men are more likely to get a yeast infection than circumcised men. This is because the uncircumcised glans is not exposed to air and does not remain as dry as that of a circumcised glans. Older adults may develop yeast infections around dentures. CAUSES  Women  Antibiotics.  Steroid medication taken for a long time.  Being overweight (obese).  Diabetes.  Poor immune condition.  Certain serious medical conditions.  Immune suppressive medications for organ transplant patients.  Chemotherapy.  Pregnancy.  Menstruation.  Stress and fatigue.  Intravenous drug use.  Oral contraceptives.  Wearing tight-fitting clothes in the crotch area.  Catching it from a sex partner who has a yeast infection.  Spermicide.  Intravenous, urinary, or other catheters. Men  Catching it from a sex partner who has a yeast infection.  Having oral or anal sex with a person who has the  infection.  Spermicide.  Diabetes.  Antibiotics.  Poor immune system.  Medications that suppress the immune system.  Intravenous drug use.  Intravenous, urinary, or other catheters. SYMPTOMS  Women  Thick, white vaginal discharge.  Vaginal itching.  Redness and swelling in and around the vagina.  Irritation of the lips of the vagina and perineum.  Blisters on the vaginal lips and perineum.  Painful sexual intercourse.  Low blood sugar (hypoglycemia).  Painful urination.  Bladder infections.  Intestinal problems such as constipation, indigestion, bad breath, bloating, increase in gas, diarrhea, or loose stools. Men  Men may develop intestinal problems such as constipation, indigestion, bad breath, bloating, increase in gas, diarrhea, or loose stools.  Dry, cracked skin on the penis with itching or discomfort.  Jock itch.  Dry, flaky skin.  Athlete's foot.  Hypoglycemia. DIAGNOSIS  Women  A history and an exam are performed.  The discharge may be examined under a microscope.  A culture may be taken of the discharge. Men  A history and an exam are performed.  Any discharge from the penis or areas of cracked skin will be looked at under the microscope and cultured.  Stool samples may be cultured. TREATMENT  Women  Vaginal antifungal suppositories and creams.  Medicated creams to decrease irritation and itching on the outside of the vagina.  Warm compresses to the perineal area to decrease swelling and discomfort.  Oral antifungal medications.  Medicated vaginal suppositories or cream for repeated or recurrent infections.  Wash and dry the irritation areas before applying the cream.  Eating yogurt with Lactobacillus may help with prevention and treatment.  Sometimes  painting the vagina with gentian violet solution may help if creams and suppositories do not work. Men  Antifungal creams and oral antifungal medications.  Sometimes  treatment must continue for 30 days after the symptoms go away to prevent recurrence. HOME CARE INSTRUCTIONS  Women  Use cotton underwear and avoid tight-fitting clothing.  Avoid colored, scented toilet paper and deodorant tampons or pads.  Do not douche.  Keep your diabetes under control.  Finish all the prescribed medications.  Keep your skin clean and dry.  Consume milk or yogurt with Lactobacillus-active culture regularly. If you get frequent yeast infections and think that is what the infection is, there are over-the-counter medications that you can get. If the infection does not show healing in 3 days, talk to your caregiver.  Tell your sex partner you have a yeast infection. Your partner may need treatment also, especially if your infection does not clear up or recurs. Men  Keep your skin clean and dry.  Keep your diabetes under control.  Finish all prescribed medications.  Tell your sex partner that you have a yeast infection so he or she can be treated if necessary. SEEK MEDICAL CARE IF:   Your symptoms do not clear up or worsen in one week after treatment.  You have an oral temperature above 102 F (38.9 C).  You have trouble swallowing or eating for a prolonged time.  You develop blisters on and around your vagina.  You develop vaginal bleeding and it is not your menstrual period.  You develop abdominal pain.  You develop intestinal problems as mentioned above.  You get weak or light-headed.  You have painful or increased urination.  You have pain during sexual intercourse. MAKE SURE YOU:   Understand these instructions.  Will watch your condition.  Will get help right away if you are not doing well or get worse.   This information is not intended to replace advice given to you by your health care provider. Make sure you discuss any questions you have with your health care provider.   Document Released: 12/15/2004 Document Revised: 11/28/2014  Document Reviewed: 05/11/2015 Elsevier Interactive Patient Education [employer].

## 2016-01-08 NOTE — Assessment & Plan Note (Signed)
Symptoms and exam consistent with eczematic type rash. Start triamcinalone. Patient also requests treatment with Diflucan to rule out a yeast infection. Declines wet prep at this time. Discussed that this is unlikely the case. Advised to continue to work with gynecology as seminal fluid may questionably causing irritation. Follow up if symptoms worsen or fail to improve.

## 2016-01-08 NOTE — Progress Notes (Signed)
Subjective:    Patient ID: William Jacobson, male    DOB: 1966-08-24, 50 y.o.   MRN: [id number]  Chief Complaint  Patient presents with  . Rash    HPI:  William Jacobson is a 50 y.o. male who  has a past medical history of Contact dermatitis and other eczema, due to unspecified cause; Pain in joint, lower leg; and Personal history of other diseases of digestive system. and presents today for an acute office visit.   1.) Possible yeast infection - Patient presents with concerns that his wife is having multiple yeast infections following intercourse. Concern that she is reacting to his semen. No current fevers, chills, penile pain, discharge, dysuria or itching. His wife has been seen by gynecology and is currently being treated.   2.) Rash - This is a new problem. Associated symptom of a rash located on his back has been going on for a couple of months. Describes the rash as itchy and dark. Modifying factors include triple antibiotic which has helped a little with the itching. Denies fevers or spreading. Denies changes to soaps, body care products or detergents.    No Known Allergies   Current Outpatient Prescriptions on File Prior to Visit  Medication Sig Dispense Refill  . Cholecalciferol (VITAMIN D PO) Take by mouth.    . Multiple Vitamins-Minerals (MULTIVITAMIN ADULT PO) Take by mouth.    . Omega-3 Fatty Acids (FISH OIL PO) Take by mouth.    . selenium sulfide (SELSUN) 2.5 % shampoo Apply 1 application topically daily as needed for irritation. 118 mL 1  . vitamin B-12 (CYANOCOBALAMIN) 250 MCG tablet Take 250 mcg by mouth daily. Reported on 01/01/2016     No current facility-administered medications on file prior to visit.     Past Surgical History  Procedure Laterality Date  . Knee arthroscopy  2008    arthroscopy right knee torn laterlal  miniscus    Past Medical History  Diagnosis Date  . Contact dermatitis and other eczema, due to unspecified cause   . Pain in  joint, lower leg     pain in right knee- torn minisicus lateral  . Personal history of other diseases of digestive system     history of hemorrhoids    Review of Systems  Constitutional: Negative for fever and chills.  Genitourinary: Negative for dysuria, urgency, frequency, hematuria, flank pain, discharge, penile swelling, scrotal swelling, penile pain and testicular pain.  Skin: Positive for rash.      Objective:    BP 128/80 mmHg  Pulse 65  Temp(Src) 98.3 F (36.8 C) (Oral)  Resp 18  Ht 5\' 11"  (1.803 m)  Wt 204 lb (92.534 kg)  BMI 28.46 kg/m2  SpO2 98% Nursing note and vital signs reviewed.  Physical Exam  Constitutional: He is oriented to person, place, and time. He appears well-developed and well-nourished. No distress.  Cardiovascular: Normal rate, regular rhythm, normal heart sounds and intact distal pulses.   Pulmonary/Chest: Effort normal and breath sounds normal.  Neurological: He is alert and oriented to person, place, and time.  Skin: Skin is warm and dry.  Dark brown plaque with distinct borders in an annular pattern with no redness or discharge. Mild scaling. Located around his left scapula.   Psychiatric: He has a normal mood and affect. His behavior is normal. Judgment and thought content normal.       Assessment & Plan:   Problem List Items Addressed This Visit  Musculoskeletal and Integument   Rash and nonspecific skin eruption - Primary    Symptoms and exam consistent with eczematic type rash. Start triamcinalone. Patient also requests treatment with Diflucan to rule out a yeast infection. Declines wet prep at this time. Discussed that this is unlikely the case. Advised to continue to work with gynecology as seminal fluid may questionably causing irritation. Follow up if symptoms worsen or fail to improve.       Relevant Medications   fluconazole (DIFLUCAN) 150 MG tablet   triamcinolone cream (KENALOG) 0.5 %

## 2016-01-10 ENCOUNTER — Encounter: Payer: Self-pay | Admitting: Gastroenterology

## 2016-01-18 ENCOUNTER — Telehealth: Payer: Self-pay | Admitting: Family

## 2016-01-18 NOTE — Telephone Encounter (Signed)
Patient called to ask that you give him a call. He states that it is a question on a drug, but did not remember the name. He would not give any other detail than that

## 2016-01-21 NOTE — Telephone Encounter (Signed)
LVM for pt to call back.

## 2016-02-23 ENCOUNTER — Other Ambulatory Visit: Payer: BLUE CROSS/BLUE SHIELD

## 2016-02-23 ENCOUNTER — Encounter: Payer: Self-pay | Admitting: Family

## 2016-02-23 ENCOUNTER — Ambulatory Visit (INDEPENDENT_AMBULATORY_CARE_PROVIDER_SITE_OTHER): Payer: BLUE CROSS/BLUE SHIELD | Admitting: Family

## 2016-02-23 VITALS — BP 128/94 | HR 75 | Temp 98.0°F | Resp 16 | Ht 71.0 in | Wt 204.0 lb

## 2016-02-23 DIAGNOSIS — B3749 Other urogenital candidiasis: Secondary | ICD-10-CM

## 2016-02-23 LAB — WET PREP, GENITAL
Clue Cells Wet Prep HPF POC: NONE SEEN — AB
TRICH WET PREP: NONE SEEN — AB
YEAST WET PREP: NONE SEEN — AB

## 2016-02-23 NOTE — Assessment & Plan Note (Signed)
No symptoms of yeast infection present. Wet prep obtained. Continue to monitor pending wet prep results.

## 2016-02-23 NOTE — Progress Notes (Signed)
Pre visit review using our clinic review tool, if applicable. No additional management support is needed unless otherwise documented below in the visit note. 

## 2016-02-23 NOTE — Patient Instructions (Signed)
Thank you for choosing [facility].  Summary/Instructions:  We will be in touch regarding the results.  If your symptoms worsen or fail to improve, please contact our office for further instruction, or in case of emergency go directly to the emergency room at the closest medical facility.

## 2016-02-23 NOTE — Progress Notes (Signed)
   Subjective:    Patient ID: William Jacobson, male    DOB: September 24, 1966, 50 y.o.   MRN: [id number]  Chief Complaint  Patient presents with  . Exposure to STD    HPI:  William Jacobson is a 50 y.o. male who  has a past medical history of Contact dermatitis and other eczema, due to unspecified cause; Pain in joint, lower leg; and Personal history of other diseases of digestive system. and presents today for a follow up office visit.   1.) Concern for yeast - Notes that he recently had intercourse with his wife and she sustained another yeast infection. There is continued concern that he has a yeast infection and is continually passing it back to her. No symptoms of a yeast infection noted.   No Known Allergies   Current Outpatient Prescriptions on File Prior to Visit  Medication Sig Dispense Refill  . Cholecalciferol (VITAMIN D PO) Take by mouth.    . fluconazole (DIFLUCAN) 150 MG tablet Take 1 tablet (150 mg total) by mouth once. 1 tablet 0  . Multiple Vitamins-Minerals (MULTIVITAMIN ADULT PO) Take by mouth.    . Omega-3 Fatty Acids (FISH OIL PO) Take by mouth.    . selenium sulfide (SELSUN) 2.5 % shampoo Apply 1 application topically daily as needed for irritation. 118 mL 1  . triamcinolone cream (KENALOG) 0.5 % Apply 1 application topically 3 (three) times daily. 30 g 0  . vitamin B-12 (CYANOCOBALAMIN) 250 MCG tablet Take 250 mcg by mouth daily. Reported on 01/01/2016     No current facility-administered medications on file prior to visit.     Review of Systems  Constitutional: Negative for fever and chills.  Genitourinary: Negative for dysuria, urgency, hematuria, flank pain, penile swelling, penile pain and testicular pain.      Objective:    BP 128/94 mmHg  Pulse 75  Temp(Src) 98 F (36.7 C) (Oral)  Resp 16  Ht 5\' 11"  (1.803 m)  Wt 204 lb (92.534 kg)  BMI 28.46 kg/m2  SpO2 96% Nursing note and vital signs reviewed.  Physical Exam  Constitutional: He is  oriented to person, place, and time. He appears well-developed and well-nourished. No distress.  Cardiovascular: Normal rate, regular rhythm, normal heart sounds and intact distal pulses.   Pulmonary/Chest: Effort normal and breath sounds normal.  Genitourinary: Penis normal. Circumcised. No phimosis, paraphimosis, penile erythema or penile tenderness. No discharge found.  No rashes or skin discoloration.  Neurological: He is alert and oriented to person, place, and time.  Skin: Skin is warm and dry.  Psychiatric: He has a normal mood and affect. His behavior is normal. Judgment and thought content normal.      Assessment & Plan:   Problem List Items Addressed This Visit      Other   Other urogenital candidiasis - Primary    No symptoms of yeast infection present. Wet prep obtained. Continue to monitor pending wet prep results.       Relevant Orders   Wet prep, genital (Completed)      I am having Mr. Hennessee maintain his selenium sulfide, Multiple Vitamins-Minerals (MULTIVITAMIN ADULT PO), Cholecalciferol (VITAMIN D PO), vitamin B-12, Omega-3 Fatty Acids (FISH OIL PO), fluconazole, and triamcinolone cream.   No orders of the defined types were placed in this encounter.     Follow-up: No Follow-up on file.  Mauricio Po, FNP

## 2016-02-24 ENCOUNTER — Telehealth: Payer: Self-pay | Admitting: Family

## 2016-02-24 NOTE — Telephone Encounter (Signed)
Please inform patient that his wet prep was normal no evidence of yeast. Therefore no treatment is recommended at this time. He may follow-up as needed.

## 2016-02-25 NOTE — Telephone Encounter (Signed)
Pt aware.

## 2016-04-19 DIAGNOSIS — B360 Pityriasis versicolor: Secondary | ICD-10-CM | POA: Diagnosis not present

## 2016-04-20 ENCOUNTER — Encounter: Payer: Self-pay | Admitting: Family

## 2016-04-20 ENCOUNTER — Other Ambulatory Visit (INDEPENDENT_AMBULATORY_CARE_PROVIDER_SITE_OTHER): Payer: BLUE CROSS/BLUE SHIELD

## 2016-04-20 ENCOUNTER — Ambulatory Visit (INDEPENDENT_AMBULATORY_CARE_PROVIDER_SITE_OTHER): Payer: BLUE CROSS/BLUE SHIELD | Admitting: Family

## 2016-04-20 VITALS — BP 122/80 | HR 67 | Temp 98.5°F | Resp 16 | Ht 71.0 in | Wt 201.0 lb

## 2016-04-20 DIAGNOSIS — Z0000 Encounter for general adult medical examination without abnormal findings: Secondary | ICD-10-CM | POA: Diagnosis not present

## 2016-04-20 DIAGNOSIS — Z711 Person with feared health complaint in whom no diagnosis is made: Secondary | ICD-10-CM

## 2016-04-20 LAB — CBC
HCT: 40.3 % (ref 39.0–52.0)
Hemoglobin: 12.8 g/dL — ABNORMAL LOW (ref 13.0–17.0)
MCHC: 31.7 g/dL (ref 30.0–36.0)
MCV: 74.1 fl — ABNORMAL LOW (ref 78.0–100.0)
PLATELETS: 248 10*3/uL (ref 150.0–400.0)
RBC: 5.44 Mil/uL (ref 4.22–5.81)
RDW: 14.5 % (ref 11.5–15.5)
WBC: 6.2 10*3/uL (ref 4.0–10.5)

## 2016-04-20 NOTE — Assessment & Plan Note (Signed)
Would like to be tested for STI with no evidence of infection with concern from his wife regarding transmission of STI. Obtain GC/Chlamydia urine probe, HSV1/HSV2, HIV and RPR. Treatment pending lab work if necessary.

## 2016-04-20 NOTE — Progress Notes (Signed)
Pre visit review using our clinic review tool, if applicable. No additional management support is needed unless otherwise documented below in the visit note. 

## 2016-04-20 NOTE — Patient Instructions (Addendum)
Thank you for choosing [facility].  Summary/Instructions:  Please stop by the lab on the basement level of the building for your blood work. Your results will be released to MyChart (or called to you) after review, usually within 72 hours after test completion. If any changes need to be made, you will be notified at that same time.  If your symptoms worsen or fail to improve, please contact our office for further instruction, or in case of emergency go directly to the emergency room at the closest medical facility.     

## 2016-04-20 NOTE — Progress Notes (Signed)
   Subjective:    Patient ID: NEFTALY CROUCHER, male    DOB: 1966/05/12, 50 y.o.   MRN: [id number]  Chief Complaint  Patient presents with  . STD check    wants to get checked for STD    HPI:  DIXIE SCHNIPKE is a 50 y.o. male who  has a past medical history of Contact dermatitis and other eczema, due to unspecified cause; Pain in joint, lower leg; and Personal history of other diseases of digestive system. and presents today for an office follow up.  1.) Concern for STD - This is a follow up. Wife continues to have concern regarding potential for patient having a STD. Denies any associated symptoms of fevers, chills, penile discharge, penile pain, dysuria, frequency or urgency of urination.  No Known Allergies   No current outpatient prescriptions on file prior to visit.   No current facility-administered medications on file prior to visit.     Review of Systems  Constitutional: Negative for fever and chills.  Genitourinary: Negative for dysuria, urgency, frequency, hematuria, penile swelling, penile pain and testicular pain.      Objective:    BP 122/80 mmHg  Pulse 67  Temp(Src) 98.5 F (36.9 C) (Oral)  Resp 16  Ht 5\' 11"  (1.803 m)  Wt 201 lb (91.173 kg)  BMI 28.05 kg/m2  SpO2 98% Nursing note and vital signs reviewed.  Physical Exam  Constitutional: He is oriented to person, place, and time. He appears well-developed and well-nourished. No distress.  Cardiovascular: Normal rate, regular rhythm, normal heart sounds and intact distal pulses.   Pulmonary/Chest: Effort normal and breath sounds normal.  Neurological: He is alert and oriented to person, place, and time.  Skin: Skin is warm and dry.  Psychiatric: He has a normal mood and affect. His behavior is normal. Judgment and thought content normal.       Assessment & Plan:   Problem List Items Addressed This Visit      Other   Concern about STD in male without diagnosis - Primary    Would like to be  tested for STI with no evidence of infection with concern from his wife regarding transmission of STI. Obtain GC/Chlamydia urine probe, HSV1/HSV2, HIV and RPR. Treatment pending lab work if necessary.       Relevant Orders   GC/chlamydia probe amp, urine   HSV 1 antibody, IgG   HSV 2 antibody, IgG   RPR   HIV antibody       I have discontinued Mr. Moronta's selenium sulfide, Multiple Vitamins-Minerals (MULTIVITAMIN ADULT PO), Cholecalciferol (VITAMIN D PO), vitamin B-12, Omega-3 Fatty Acids (FISH OIL PO), fluconazole, and triamcinolone cream.   Follow-up: Return if symptoms worsen or fail to improve.  Mauricio Po, FNP

## 2016-04-21 LAB — HSV 2 ANTIBODY, IGG

## 2016-04-21 LAB — RPR

## 2016-04-21 LAB — HIV ANTIBODY (ROUTINE TESTING W REFLEX): HIV 1&2 Ab, 4th Generation: NONREACTIVE

## 2016-04-21 LAB — HSV 1 ANTIBODY, IGG: HSV 1 Glycoprotein G Ab, IgG: 42.6 Index — ABNORMAL HIGH (ref <0.90)

## 2016-04-22 LAB — GC/CHLAMYDIA PROBE AMP
CT Probe RNA: NOT DETECTED
GC Probe RNA: NOT DETECTED

## 2016-04-24 ENCOUNTER — Telehealth: Payer: Self-pay | Admitting: Family

## 2016-04-24 NOTE — Telephone Encounter (Signed)
Please inform patient that his blood and urine are negative for sexually transmitted disease. He did test positive for both HSV1 (oral herpes) and HSV2 (gential herpes). He is contagious during outbreaks, but otherwise there is no cure and symptoms can be managed with over the counter medication or prescription medications for outbreaks. He may never experience an outbreak or he may have several. There is no indication for for these to be the cause of his wife's recurrent infections.

## 2016-04-27 NOTE — Telephone Encounter (Signed)
Pt aware of results 

## 2016-08-01 DIAGNOSIS — M9902 Segmental and somatic dysfunction of thoracic region: Secondary | ICD-10-CM | POA: Diagnosis not present

## 2016-08-01 DIAGNOSIS — M9905 Segmental and somatic dysfunction of pelvic region: Secondary | ICD-10-CM | POA: Diagnosis not present

## 2016-08-01 DIAGNOSIS — M5386 Other specified dorsopathies, lumbar region: Secondary | ICD-10-CM | POA: Diagnosis not present

## 2016-08-01 DIAGNOSIS — M9903 Segmental and somatic dysfunction of lumbar region: Secondary | ICD-10-CM | POA: Diagnosis not present

## 2016-08-22 DIAGNOSIS — M5386 Other specified dorsopathies, lumbar region: Secondary | ICD-10-CM | POA: Diagnosis not present

## 2016-08-22 DIAGNOSIS — M9903 Segmental and somatic dysfunction of lumbar region: Secondary | ICD-10-CM | POA: Diagnosis not present

## 2016-08-22 DIAGNOSIS — M9902 Segmental and somatic dysfunction of thoracic region: Secondary | ICD-10-CM | POA: Diagnosis not present

## 2016-08-22 DIAGNOSIS — M9905 Segmental and somatic dysfunction of pelvic region: Secondary | ICD-10-CM | POA: Diagnosis not present

## 2016-09-05 LAB — BASIC METABOLIC PANEL
BUN: 16 mg/dL (ref 4–21)
Creatinine: 1.4 mg/dL — AB (ref <=1.3)
GLUCOSE: 108 mg/dL
POTASSIUM: 4.9 mmol/L (ref 3.4–5.3)
Sodium: 144 mmol/L (ref 137–147)

## 2016-09-05 LAB — LIPID PANEL
Cholesterol: 154 mg/dL (ref 0–200)
HDL: 45 mg/dL (ref 35–70)
LDL CALC: 87 mg/dL
Triglycerides: 110 mg/dL (ref 40–160)

## 2016-09-05 LAB — CBC AND DIFFERENTIAL
HEMATOCRIT: 45 % (ref 41–53)
Hemoglobin: 13.2 g/dL — AB (ref 13.5–17.5)
PLATELETS: 241 10*3/uL (ref 150–399)

## 2016-09-05 LAB — HEMOGLOBIN A1C: HEMOGLOBIN A1C: 6

## 2016-09-08 ENCOUNTER — Encounter: Payer: Self-pay | Admitting: Family

## 2016-09-08 ENCOUNTER — Ambulatory Visit (INDEPENDENT_AMBULATORY_CARE_PROVIDER_SITE_OTHER): Payer: BLUE CROSS/BLUE SHIELD | Admitting: Family

## 2016-09-08 VITALS — BP 128/88 | HR 67 | Temp 98.8°F | Resp 16 | Ht 71.0 in | Wt 202.8 lb

## 2016-09-08 DIAGNOSIS — Z0000 Encounter for general adult medical examination without abnormal findings: Secondary | ICD-10-CM | POA: Diagnosis not present

## 2016-09-08 NOTE — Progress Notes (Signed)
Subjective:    Patient ID: William Jacobson, male    DOB: 02/16/1966, 50 y.o.   MRN: [id number]  Chief Complaint  Patient presents with  . CPE    not fasting    HPI:  William Jacobson is a 50 y.o. male who presents today for an annual wellness visit.   1) Health Maintenance -   Diet - Averages about 1-2 meals per day consisting of a regular diet; Caffeine intake of 1/2-1 about once per week.   Exercise - 5x per week consisting cardio and resistance training.    2) Preventative Exams / Immunizations:  Dental -- Up to date  Vision -- Due for exam    Health Maintenance  Topic Date Due  . INFLUENZA VACCINE  02/18/2017 (Originally 06/21/2016)  . COLONOSCOPY  12/31/2018  . TETANUS/TDAP  01/19/2025  . HIV Screening  Completed     There is no immunization history on file for this patient.   No Known Allergies   No outpatient prescriptions prior to visit.   No facility-administered medications prior to visit.      Past Medical History:  Diagnosis Date  . Contact dermatitis and other eczema, due to unspecified cause   . Pain in joint, lower leg    pain in right knee- torn minisicus lateral  . Personal history of other diseases of digestive system    history of hemorrhoids     Past Surgical History:  Procedure Laterality Date  . KNEE ARTHROSCOPY  2008   arthroscopy right knee torn laterlal  miniscus     Family History  Problem Relation Age of Onset  . Diabetes Mother   . Healthy Father   . Heart attack Maternal Grandmother   . Alcohol abuse Maternal Grandfather   . Cirrhosis Maternal Grandfather   . Colon cancer Neg Hx      Social History   Social History  . Marital status: Married    Spouse name: N/A  . Number of children: 3  . Years of education: 72   Occupational History  . Maintenance Superviser     Social History Main Topics  . Smoking status: Never Smoker  . Smokeless tobacco: Never Used  . Alcohol use 0.0 oz/week     Comment:  Occasionally  . Drug use: No  . Sexual activity: Yes    Partners: Female   Other Topics Concern  . Not on file   Social History Narrative   [employer], [employer] [place]. 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 MARRIAGE- HITTING A LITTLE TOUGH SPOT RE: [employer], PARENTING DIFFICULTIES WITH HIS OLDER SON FROM PREVIOUS MARRIAGE      Review of Systems  Constitutional: Denies fever, chills, fatigue, or significant weight gain/loss. HENT: Head: Denies headache or neck pain Ears: Denies changes in hearing, ringing in ears, earache, drainage Nose: Denies discharge, stuffiness, itching, nosebleed, sinus pain Throat: Denies sore throat, hoarseness, dry mouth, sores, thrush Eyes: Denies loss/changes in vision, pain, redness, blurry/double vision, flashing lights Cardiovascular: Denies chest pain/discomfort, tightness, palpitations, shortness of breath with activity, difficulty lying down, swelling, sudden awakening with shortness of breath Respiratory: Denies shortness of breath, cough, sputum production, wheezing Gastrointestinal: Denies dysphasia, heartburn, change in appetite, nausea, change in bowel habits, rectal bleeding, constipation, diarrhea, yellow skin or eyes Genitourinary: Denies frequency, urgency, burning/pain, blood in urine, incontinence, change in urinary strength. Musculoskeletal: Denies muscle/joint pain, stiffness, back pain, redness or swelling of  joints, trauma Skin: Denies rashes, lumps, itching, dryness, color changes, or hair/nail changes Neurological: Denies dizziness, fainting, seizures, weakness, numbness, tingling, tremor Psychiatric - Denies nervousness, stress, depression or memory loss Endocrine: Denies heat or cold intolerance, sweating, frequent urination, excessive thirst, changes in appetite Hematologic: Denies ease of bruising or bleeding     Objective:     BP 128/88 (BP Location: Left Arm, Patient Position: Sitting, Cuff  Size: Large)   Pulse 67   Temp 98.8 F (37.1 C) (Oral)   Resp 16   Ht 5\' 11"  (1.803 m)   Wt 202 lb 12.8 oz (92 kg)   SpO2 97%   BMI 28.28 kg/m  Nursing note and vital signs reviewed.  Physical Exam  Constitutional: He is oriented to person, place, and time. He appears well-developed and well-nourished.  HENT:  Head: Normocephalic.  Right Ear: Hearing, tympanic membrane, external ear and ear canal normal.  Left Ear: Hearing, tympanic membrane, external ear and ear canal normal.  Nose: Nose normal.  Mouth/Throat: Uvula is midline, oropharynx is clear and moist and mucous membranes are normal.  Eyes: Conjunctivae and EOM are normal. Pupils are equal, round, and reactive to light.  Neck: Neck supple. No JVD present. No tracheal deviation present. No thyromegaly present.  Cardiovascular: Normal rate, regular rhythm, normal heart sounds and intact distal pulses.   Pulmonary/Chest: Effort normal and breath sounds normal.  Abdominal: Soft. Bowel sounds are normal. He exhibits no distension and no mass. There is no tenderness. There is no rebound and no guarding.  Musculoskeletal: Normal range of motion. He exhibits no edema or tenderness.  Lymphadenopathy:    He has no cervical adenopathy.  Neurological: He is alert and oriented to person, place, and time. He has normal reflexes. No cranial nerve deficit. He exhibits normal muscle tone. Coordination normal.  Skin: Skin is warm and dry.  Psychiatric: He has a normal mood and affect. His behavior is normal. Judgment and thought content normal.       Assessment & Plan:   Problem List Items Addressed This Visit      Other   Routine general medical examination at a health care facility - Primary    1) Anticipatory Guidance: Discussed importance of wearing a seatbelt while driving and not texting while driving; changing batteries in smoke detector at least once annually; wearing suntan lotion when outside; eating a balanced and moderate  diet; getting physical activity at least 30 minutes per day.  2) Immunizations / Screenings / Labs:  Declines influenza. All other immunizations are up-to-date per recommendations. Due for new vision exam encouraged to be completed independently. Obtain PSA for prostate cancer screening. All other screenings are up-to-date per recommendations. Previously drawn blood work reviewed with patient. Obtain renal panel.  Overall well exam with risk factors for cardiovascular disease including overweight. Recommend weight loss of 5-10% of current body weight through nutrition and physical activity. Continue other healthy lifestyle behaviors and choices. Follow-up prevention exam in 1 year. Follow-up office visit pending blood work as necessary.        Relevant Orders   PSA   Renal Function Panel   Hemoccult Cards (X3 cards)    Other Visit Diagnoses   None.      Mr. Thiesen does not currently have medications on file.   Follow-up: Return in about 6 months (around 03/09/2017), or if symptoms worsen or fail to improve.   Mauricio Po, FNP

## 2016-09-08 NOTE — Patient Instructions (Signed)
Thank you for choosing [facility].  SUMMARY AND INSTRUCTIONS:  Labs:  Please stop by the lab on the lower level of the building for your blood work. Your results will be released to [place] (or called to you) after review, usually within 72 hours after test completion. If any changes need to be made, you will be notified at that same time.  1.) The lab is open from 7:30am to 5:30 pm Monday-Friday 2.) No appointment is necessary 3.) Fasting (if needed) is 6-8 hours after food and drink; black coffee and water are okay   Follow up:  If your symptoms worsen or fail to improve, please contact our office for further instruction, or in case of emergency go directly to the emergency room at the closest medical facility.   Health Maintenance, Male A healthy lifestyle and preventative care can promote health and wellness.  Maintain regular health, dental, and eye exams.  Eat a healthy diet. Foods like vegetables, fruits, whole grains, low-fat dairy products, and lean protein foods contain the nutrients you need and are low in calories. Decrease your intake of foods high in solid fats, added sugars, and salt. Get information about a proper diet from your health care provider, if necessary.  Regular physical exercise is one of the most important things you can do for your health. Most adults should get at least 150 minutes of moderate-intensity exercise (any activity that increases your heart rate and causes you to sweat) each week. In addition, most adults need muscle-strengthening exercises on 2 or more days a week.   Maintain a healthy weight. The body mass index (BMI) is a screening tool to identify possible weight problems. It provides an estimate of body fat based on height and weight. Your health care provider can find your BMI and can help you achieve or maintain a healthy weight. For males 20 years and older:  A BMI below 18.5 is considered underweight.  A BMI of 18.5 to 24.9 is  normal.  A BMI of 25 to 29.9 is considered overweight.  A BMI of 30 and above is considered obese.  Maintain normal blood lipids and cholesterol by exercising and minimizing your intake of saturated fat. Eat a balanced diet with plenty of fruits and vegetables. Blood tests for lipids and cholesterol should begin at age 22 and be repeated every 5 years. If your lipid or cholesterol levels are high, you are over age 44, or you are at high risk for heart disease, you may need your cholesterol levels checked more frequently.Ongoing high lipid and cholesterol levels should be treated with medicines if diet and exercise are not working.  If you smoke, find out from your health care provider how to quit. If you do not use tobacco, do not start.  Lung cancer screening is recommended for adults aged 55-80 years who are at high risk for developing lung cancer because of a history of smoking. A yearly low-dose CT scan of the lungs is recommended for people who have at least a 30-pack-year history of smoking and are current smokers or have quit within the past 15 years. A pack year of smoking is smoking an average of 1 pack of cigarettes a day for 1 year (for example, a 30-pack-year history of smoking could mean smoking 1 pack a day for 30 years or 2 packs a day for 15 years). Yearly screening should continue until the smoker has stopped smoking for at least 15 years. Yearly screening should be stopped for  people who develop a health problem that would prevent them from having lung cancer treatment.  If you choose to drink alcohol, do not have more than 2 drinks per day. One drink is considered to be 12 oz (360 mL) of beer, 5 oz (150 mL) of wine, or 1.5 oz (45 mL) of liquor.  Avoid the use of street drugs. Do not share needles with anyone. Ask for help if you need support or instructions about stopping the use of drugs.  High blood pressure causes heart disease and increases the risk of stroke. High blood  pressure is more likely to develop in:  People who have blood pressure in the end of the normal range (100-139/85-89 mm Hg).  People who are overweight or obese.  People who are African American.  If you are 23-55 years of age, have your blood pressure checked every 3-5 years. If you are 7 years of age or older, have your blood pressure checked every year. You should have your blood pressure measured twice--once when you are at a hospital or clinic, and once when you are not at a hospital or clinic. Record the average of the two measurements. To check your blood pressure when you are not at a hospital or clinic, you can use:  An automated blood pressure machine at a pharmacy.  A home blood pressure monitor.  If you are 51-73 years old, ask your health care provider if you should take aspirin to prevent heart disease.  Diabetes screening involves taking a blood sample to check your fasting blood sugar level. This should be done once every 3 years after age 35 if you are at a normal weight and without risk factors for diabetes. Testing should be considered at a younger age or be carried out more frequently if you are overweight and have at least 1 risk factor for diabetes.  Colorectal cancer can be detected and often prevented. Most routine colorectal cancer screening begins at the age of 55 and continues through age 40. However, your health care provider may recommend screening at an earlier age if you have risk factors for colon cancer. On a yearly basis, your health care provider may provide home test kits to check for hidden blood in the stool. A small camera at the end of a tube may be used to directly examine the colon (sigmoidoscopy or colonoscopy) to detect the earliest forms of colorectal cancer. Talk to your health care provider about this at age 78 when routine screening begins. A direct exam of the colon should be repeated every 5-10 years through age 9, unless early forms of  precancerous polyps or small growths are found.  People who are at an increased risk for hepatitis B should be screened for this virus. You are considered at high risk for hepatitis B if:  You were born in a country where hepatitis B occurs often. Talk with your health care provider about which countries are considered high risk.  Your parents were born in a high-risk country and you have not received a shot to protect against hepatitis B (hepatitis B vaccine).  You have HIV or AIDS.  You use needles to inject street drugs.  You live with, or have sex with, someone who has hepatitis B.  You are a man who has sex with other men (MSM).  You get hemodialysis treatment.  You take certain medicines for conditions like cancer, organ transplantation, and autoimmune conditions.  Hepatitis C blood testing is recommended for all  people born from [birth year] through 1966 and any individual with known risk factors for hepatitis C.  Healthy men should no longer receive prostate-specific antigen (PSA) blood tests as part of routine cancer screening. Talk to your health care provider about prostate cancer screening.  Testicular cancer screening is not recommended for adolescents or adult males who have no symptoms. Screening includes self-exam, a health care provider exam, and other screening tests. Consult with your health care provider about any symptoms you have or any concerns you have about testicular cancer.  Practice safe sex. Use condoms and avoid high-risk sexual practices to reduce the spread of sexually transmitted infections (STIs).  You should be screened for STIs, including gonorrhea and chlamydia if:  You are sexually active and are younger than 24 years.  You are older than 24 years, and your health care provider tells you that you are at risk for this type of infection.  Your sexual activity has changed since you were last screened, and you are at an increased risk for chlamydia or  gonorrhea. Ask your health care provider if you are at risk.  If you are at risk of being infected with HIV, it is recommended that you take a prescription medicine daily to prevent HIV infection. This is called pre-exposure prophylaxis (PrEP). You are considered at risk if:  You are a man who has sex with other men (MSM).  You are a heterosexual man who is sexually active with multiple partners.  You take drugs by injection.  You are sexually active with a partner who has HIV.  Talk with your health care provider about whether you are at high risk of being infected with HIV. If you choose to begin PrEP, you should first be tested for HIV. You should then be tested every 3 months for as long as you are taking PrEP.  Use sunscreen. Apply sunscreen liberally and repeatedly throughout the day. You should seek shade when your shadow is shorter than you. Protect yourself by wearing long sleeves, pants, a wide-brimmed hat, and sunglasses year round whenever you are outdoors.  Tell your health care provider of new moles or changes in moles, especially if there is a change in shape or color. Also, tell your health care provider if a mole is larger than the size of a pencil eraser.  A one-time screening for abdominal aortic aneurysm (AAA) and surgical repair of large AAAs by ultrasound is recommended for men aged 36-75 years who are current or former smokers.  Stay current with your vaccines (immunizations).   This information is not intended to replace advice given to you by your health care provider. Make sure you discuss any questions you have with your health care provider.   Document Released: 05/05/2008 Document Revised: 11/28/2014 Document Reviewed: 04/04/2011 Elsevier Interactive Patient Education [employer].

## 2016-09-08 NOTE — Assessment & Plan Note (Addendum)
1) Anticipatory Guidance: Discussed importance of wearing a seatbelt while driving and not texting while driving; changing batteries in smoke detector at least once annually; wearing suntan lotion when outside; eating a balanced and moderate diet; getting physical activity at least 30 minutes per day.  2) Immunizations / Screenings / Labs:  Declines influenza. All other immunizations are up-to-date per recommendations. Due for new vision exam encouraged to be completed independently. Obtain PSA for prostate cancer screening. All other screenings are up-to-date per recommendations. Previously drawn blood work reviewed with patient. Obtain renal panel.  Overall well exam with risk factors for cardiovascular disease including overweight. Recommend weight loss of 5-10% of current body weight through nutrition and physical activity. Continue other healthy lifestyle behaviors and choices. Follow-up prevention exam in 1 year. Follow-up office visit pending blood work as necessary.

## 2016-09-13 ENCOUNTER — Encounter: Payer: Self-pay | Admitting: Family

## 2016-09-13 ENCOUNTER — Other Ambulatory Visit (INDEPENDENT_AMBULATORY_CARE_PROVIDER_SITE_OTHER): Payer: BLUE CROSS/BLUE SHIELD

## 2016-09-13 ENCOUNTER — Telehealth: Payer: Self-pay

## 2016-09-13 DIAGNOSIS — Z0000 Encounter for general adult medical examination without abnormal findings: Secondary | ICD-10-CM | POA: Diagnosis not present

## 2016-09-13 LAB — HEMOCCULT SLIDES (X 3 CARDS)
Fecal Occult Blood: NEGATIVE
OCCULT 1: NEGATIVE
OCCULT 2: NEGATIVE
OCCULT 3: NEGATIVE
OCCULT 4: NEGATIVE
OCCULT 5: NEGATIVE

## 2016-10-03 DIAGNOSIS — M9903 Segmental and somatic dysfunction of lumbar region: Secondary | ICD-10-CM | POA: Diagnosis not present

## 2016-10-03 DIAGNOSIS — M9902 Segmental and somatic dysfunction of thoracic region: Secondary | ICD-10-CM | POA: Diagnosis not present

## 2016-10-03 DIAGNOSIS — M5386 Other specified dorsopathies, lumbar region: Secondary | ICD-10-CM | POA: Diagnosis not present

## 2016-10-03 DIAGNOSIS — M9905 Segmental and somatic dysfunction of pelvic region: Secondary | ICD-10-CM | POA: Diagnosis not present

## 2016-11-17 NOTE — Telephone Encounter (Signed)
error 

## 2017-01-02 ENCOUNTER — Encounter: Payer: Self-pay | Admitting: Family

## 2017-01-02 ENCOUNTER — Ambulatory Visit: Payer: BLUE CROSS/BLUE SHIELD | Admitting: Family

## 2017-01-02 ENCOUNTER — Ambulatory Visit (INDEPENDENT_AMBULATORY_CARE_PROVIDER_SITE_OTHER): Payer: BLUE CROSS/BLUE SHIELD | Admitting: Family

## 2017-01-02 VITALS — BP 136/92 | HR 64 | Temp 98.4°F | Resp 16 | Ht 71.0 in | Wt 203.0 lb

## 2017-01-02 DIAGNOSIS — Z711 Person with feared health complaint in whom no diagnosis is made: Secondary | ICD-10-CM | POA: Diagnosis not present

## 2017-01-02 NOTE — Progress Notes (Signed)
   Subjective:    Patient ID: William Jacobson, male    DOB: 27-Jul-1966, 51 y.o.   MRN: [id number]  Chief Complaint  Patient presents with  . Follow-up    HPI:  William Jacobson is a 51 y.o. male who  has a past medical history of Contact dermatitis and other eczema, due to unspecified cause; Pain in joint, lower leg; and Personal history of other diseases of digestive system. and presents today for an office visit.  Continues to have concern secondary to his wife sustaining yeast infections following intercourse. All previous lab work including STD testing and wet preps have returned negative. Not currently experiencing and symptoms of discharge, pain or rashes.   No Known Allergies    No outpatient prescriptions prior to visit.   No facility-administered medications prior to visit.      Review of Systems  Constitutional: Negative for chills and fever.  Genitourinary: Negative for decreased urine volume, difficulty urinating, discharge, dysuria, flank pain, frequency, genital sores, hematuria, penile pain, penile swelling, scrotal swelling, testicular pain and urgency.      Objective:    BP (!) 136/92 (BP Location: Left Arm, Patient Position: Sitting, Cuff Size: Large)   Pulse 64   Temp 98.4 F (36.9 C) (Oral)   Resp 16   Ht 5\' 11"  (1.803 m)   Wt 203 lb (92.1 kg)   SpO2 98%   BMI 28.31 kg/m  Nursing note and vital signs reviewed.  Physical Exam  Constitutional: He is oriented to person, place, and time. He appears well-developed and well-nourished. No distress.  Cardiovascular: Normal rate, regular rhythm, normal heart sounds and intact distal pulses.   Pulmonary/Chest: Effort normal and breath sounds normal.  Neurological: He is alert and oriented to person, place, and time.  Skin: Skin is warm and dry.  Psychiatric: He has a normal mood and affect. His behavior is normal. Judgment and thought content normal.       Assessment & Plan:   Problem List Items  Addressed This Visit      Other   Concern about STD in male without diagnosis - Primary    Wife continues to experience yeast infections following intercourse. All previous testing has been negative and not currently experiencing symptoms. Recommend patient and wife follow up with gynecology for further assessment.           Follow-up: Return if symptoms worsen or fail to improve.  Mauricio Po, FNP

## 2017-01-02 NOTE — Patient Instructions (Signed)
Thank you for choosing [facility].  SUMMARY AND INSTRUCTIONS:  Recommend your wife follows up with Gynecology.  No further labwork/treatement are needed at this time.  Follow up:  If your symptoms worsen or fail to improve, please contact our office for further instruction, or in case of emergency go directly to the emergency room at the closest medical facility.

## 2017-01-02 NOTE — Assessment & Plan Note (Signed)
Wife continues to experience yeast infections following intercourse. All previous testing has been negative and not currently experiencing symptoms. Recommend patient and wife follow up with gynecology for further assessment.

## 2017-03-03 DIAGNOSIS — M9905 Segmental and somatic dysfunction of pelvic region: Secondary | ICD-10-CM | POA: Diagnosis not present

## 2017-03-03 DIAGNOSIS — M5386 Other specified dorsopathies, lumbar region: Secondary | ICD-10-CM | POA: Diagnosis not present

## 2017-03-03 DIAGNOSIS — M9903 Segmental and somatic dysfunction of lumbar region: Secondary | ICD-10-CM | POA: Diagnosis not present

## 2017-03-03 DIAGNOSIS — M9902 Segmental and somatic dysfunction of thoracic region: Secondary | ICD-10-CM | POA: Diagnosis not present

## 2017-05-19 ENCOUNTER — Encounter: Payer: BLUE CROSS/BLUE SHIELD | Admitting: Family

## 2017-08-02 ENCOUNTER — Telehealth: Payer: Self-pay | Admitting: Family

## 2017-08-02 NOTE — Telephone Encounter (Signed)
LVM for pt to call back in regards.  

## 2017-08-02 NOTE — Telephone Encounter (Signed)
Pt would like a letter to take to court stating the yeast infection he had he could not transmit it to his wife. He was told by Marya Amsler he could not transmit it to his wife. Please advise and call back.

## 2017-08-03 LAB — HEPATIC FUNCTION PANEL
ALK PHOS: 61 (ref 25–125)
ALT: 36 (ref 10–40)
AST: 35 (ref 14–40)
Bilirubin, Total: 0.9

## 2017-08-03 LAB — BASIC METABOLIC PANEL
BUN: 15 (ref 4–21)
CREATININE: 1.2 (ref <=1.3)
Glucose: 96
Potassium: 4.2 (ref 3.4–5.3)
Sodium: 141 (ref 137–147)

## 2017-08-03 LAB — CBC AND DIFFERENTIAL
HCT: 43 (ref 41–53)
HEMOGLOBIN: 13.1 — AB (ref 13.5–17.5)
Neutrophils Absolute: 31
PLATELETS: 264 (ref 150–399)
WBC: 5.1

## 2017-08-03 LAB — LIPID PANEL
CHOLESTEROL: 181 (ref 0–200)
HDL: 3 — AB (ref 35–70)
LDL Cholesterol: 113
Triglycerides: 76 (ref 40–160)

## 2017-08-14 ENCOUNTER — Encounter: Payer: Self-pay | Admitting: Family

## 2017-08-14 ENCOUNTER — Ambulatory Visit (INDEPENDENT_AMBULATORY_CARE_PROVIDER_SITE_OTHER): Payer: BLUE CROSS/BLUE SHIELD | Admitting: Family

## 2017-08-14 VITALS — BP 134/86 | HR 57 | Temp 98.1°F | Resp 16 | Ht 71.0 in | Wt 193.0 lb

## 2017-08-14 DIAGNOSIS — Z0000 Encounter for general adult medical examination without abnormal findings: Secondary | ICD-10-CM | POA: Diagnosis not present

## 2017-08-14 NOTE — Assessment & Plan Note (Addendum)
1) Anticipatory Guidance: Discussed importance of wearing a seatbelt while driving and not texting while driving; changing batteries in smoke detector at least once annually; wearing suntan lotion when outside; eating a balanced and moderate diet; getting physical activity at least 30 minutes per day.  2) Immunizations / Screenings / Labs:  Declines influenza. All other immunizations are up-to-date per recommendations. Colon cancer screening is up-to-date per recommendations. Patient has blood work being completed through work which we will await results to determine additional blood work needed. Ensure PSA ordered for prostate cancer screening.  Overall well exam with minimal risk factors for cardiovascular disease at present. He is physically active at work and performs resistance training multiple times throughout the week. He eats nutritional intake that is moderate, balance, and varied. Continue healthy lifestyle behaviors and choices. Follow-up prevention exam in 1 year. Follow-up office visit pending blood work and for chronic conditions as needed.
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
## 2017-08-14 NOTE — Patient Instructions (Addendum)
Thank you for choosing [facility].  SUMMARY AND INSTRUCTIONS:   Overall you are doing very well!  Follow up:  If your symptoms worsen or fail to improve, please contact our office for further instruction, or in case of emergency go directly to the emergency room at the closest medical facility.     Health Maintenance, Male A healthy lifestyle and preventive care is important for your health and wellness. Ask your health care provider about what schedule of regular examinations is right for you. What should I know about weight and diet? Eat a Healthy Diet  Eat plenty of vegetables, fruits, whole grains, low-fat dairy products, and lean protein.  Do not eat a lot of foods high in solid fats, added sugars, or salt.  Maintain a Healthy Weight Regular exercise can help you achieve or maintain a healthy weight. You should:  Do at least 150 minutes of exercise each week. The exercise should increase your heart rate and make you sweat (moderate-intensity exercise).  Do strength-training exercises at least twice a week.  Watch Your Levels of Cholesterol and Blood Lipids  Have your blood tested for lipids and cholesterol every 5 years starting at 52 years of age. If you are at high risk for heart disease, you should start having your blood tested when you are 52 years old. You may need to have your cholesterol levels checked more often if: ? Your lipid or cholesterol levels are high. ? You are older than 52 years of age. ? You are at high risk for heart disease.  What should I know about cancer screening? Many types of cancers can be detected early and may often be prevented. Lung Cancer  You should be screened every year for lung cancer if: ? You are a current smoker who has smoked for at least 30 years. ? You are a former smoker who has quit within the past 15 years.  Talk to your health care provider about your screening options, when you should start screening, and how  often you should be screened.  Colorectal Cancer  Routine colorectal cancer screening usually begins at 52 years of age and should be repeated every 5-10 years until you are 52 years old. You may need to be screened more often if early forms of precancerous polyps or small growths are found. Your health care provider may recommend screening at an earlier age if you have risk factors for colon cancer.  Your health care provider may recommend using home test kits to check for hidden blood in the stool.  A small camera at the end of a tube can be used to examine your colon (sigmoidoscopy or colonoscopy). This checks for the earliest forms of colorectal cancer.  Prostate and Testicular Cancer  Depending on your age and overall health, your health care provider may do certain tests to screen for prostate and testicular cancer.  Talk to your health care provider about any symptoms or concerns you have about testicular or prostate cancer.  Skin Cancer  Check your skin from head to toe regularly.  Tell your health care provider about any new moles or changes in moles, especially if: ? There is a change in a mole's size, shape, or color. ? You have a mole that is larger than a pencil eraser.  Always use sunscreen. Apply sunscreen liberally and repeat throughout the day.  Protect yourself by wearing long sleeves, pants, a wide-brimmed hat, and sunglasses when outside.  What should I know about heart  disease, diabetes, and high blood pressure?  If you are 72-22 years of age, have your blood pressure checked every 3-5 years. If you are 66 years of age or older, have your blood pressure checked every year. You should have your blood pressure measured twice-once when you are at a hospital or clinic, and once when you are not at a hospital or clinic. Record the average of the two measurements. To check your blood pressure when you are not at a hospital or clinic, you can use: ? An automated blood  pressure machine at a pharmacy. ? A home blood pressure monitor.  Talk to your health care provider about your target blood pressure.  If you are between 23-36 years old, ask your health care provider if you should take aspirin to prevent heart disease.  Have regular diabetes screenings by checking your fasting blood sugar level. ? If you are at a normal weight and have a low risk for diabetes, have this test once every three years after the age of 41. ? If you are overweight and have a high risk for diabetes, consider being tested at a younger age or more often.  A one-time screening for abdominal aortic aneurysm (AAA) by ultrasound is recommended for men aged 60-75 years who are current or former smokers. What should I know about preventing infection? Hepatitis B If you have a higher risk for hepatitis B, you should be screened for this virus. Talk with your health care provider to find out if you are at risk for hepatitis B infection. Hepatitis C Blood testing is recommended for:  Everyone born from [birth year] through 1966.  Anyone with known risk factors for hepatitis C.  Sexually Transmitted Diseases (STDs)  You should be screened each year for STDs including gonorrhea and chlamydia if: ? You are sexually active and are younger than 52 years of age. ? You are older than 52 years of age and your health care provider tells you that you are at risk for this type of infection. ? Your sexual activity has changed since you were last screened and you are at an increased risk for chlamydia or gonorrhea. Ask your health care provider if you are at risk.  Talk with your health care provider about whether you are at high risk of being infected with HIV. Your health care provider may recommend a prescription medicine to help prevent HIV infection.  What else can I do?  Schedule regular health, dental, and eye exams.  Stay current with your vaccines (immunizations).  Do not use any tobacco  products, such as cigarettes, chewing tobacco, and e-cigarettes. If you need help quitting, ask your health care provider.  Limit alcohol intake to no more than 2 drinks per day. One drink equals 12 ounces of beer, 5 ounces of wine, or 1 ounces of hard liquor.  Do not use street drugs.  Do not share needles.  Ask your health care provider for help if you need support or information about quitting drugs.  Tell your health care provider if you often feel depressed.  Tell your health care provider if you have ever been abused or do not feel safe at home. This information is not intended to replace advice given to you by your health care provider. Make sure you discuss any questions you have with your health care provider. Document Released: 05/05/2008 Document Revised: 07/06/2016 Document Reviewed: 08/11/2015 Elsevier Interactive Patient Education  [employer].

## 2017-08-21 ENCOUNTER — Encounter: Payer: Self-pay | Admitting: Family

## 2017-09-11 ENCOUNTER — Encounter: Payer: BLUE CROSS/BLUE SHIELD | Admitting: Family

## 2017-11-21 HISTORY — PX: PROSTATE BIOPSY: SHX241

## 2018-03-09 ENCOUNTER — Emergency Department (HOSPITAL_COMMUNITY): Payer: BLUE CROSS/BLUE SHIELD

## 2018-03-09 ENCOUNTER — Emergency Department (HOSPITAL_COMMUNITY)
Admission: EM | Admit: 2018-03-09 | Discharge: 2018-03-09 | Disposition: A | Discharge status: Home / Self Care | Payer: BLUE CROSS/BLUE SHIELD | Attending: Emergency Medicine | Admitting: Emergency Medicine

## 2018-03-09 ENCOUNTER — Other Ambulatory Visit: Payer: Self-pay

## 2018-03-09 ENCOUNTER — Encounter (HOSPITAL_COMMUNITY): Payer: Self-pay

## 2018-03-09 DIAGNOSIS — R0781 Pleurodynia: Secondary | ICD-10-CM | POA: Diagnosis not present

## 2018-03-09 DIAGNOSIS — S279XXA Injury of unspecified intrathoracic organ, initial encounter: Secondary | ICD-10-CM | POA: Diagnosis not present

## 2018-03-09 DIAGNOSIS — S299XXA Unspecified injury of thorax, initial encounter: Secondary | ICD-10-CM | POA: Diagnosis not present

## 2018-03-09 DIAGNOSIS — R0789 Other chest pain: Secondary | ICD-10-CM | POA: Diagnosis not present

## 2018-03-09 MED ORDER — KETOROLAC TROMETHAMINE 30 MG/ML IJ SOLN
30.0000 mg | Freq: Once | INTRAMUSCULAR | Status: AC
  Administered 2018-03-09: 30 mg via INTRAMUSCULAR
  Filled 2018-03-09: qty 1

## 2018-03-09 MED ORDER — IBUPROFEN 600 MG PO TABS: 600.0000 mg | ORAL_TABLET | Freq: Four times a day (QID) | ORAL | 0 refills | Status: DC | PRN

## 2018-03-09 MED ORDER — CYCLOBENZAPRINE HCL 10 MG PO TABS: 10.0000 mg | ORAL_TABLET | Freq: Two times a day (BID) | ORAL | 0 refills | Status: DC | PRN
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
## 2018-03-09 NOTE — ED Notes (Signed)
Patient transported to X-ray 
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
## 2018-03-28 ENCOUNTER — Encounter: Payer: Self-pay | Admitting: Family Medicine

## 2018-03-28 ENCOUNTER — Ambulatory Visit: Payer: BLUE CROSS/BLUE SHIELD | Admitting: Family Medicine

## 2018-03-28 VITALS — BP 134/80 | HR 63 | Ht 71.0 in | Wt 185.5 lb

## 2018-03-28 DIAGNOSIS — Z87898 Personal history of other specified conditions: Secondary | ICD-10-CM | POA: Diagnosis not present

## 2018-03-28 DIAGNOSIS — R972 Elevated prostate specific antigen [PSA]: Secondary | ICD-10-CM

## 2018-03-28 DIAGNOSIS — S29011D Strain of muscle and tendon of front wall of thorax, subsequent encounter: Secondary | ICD-10-CM

## 2018-03-28 DIAGNOSIS — S29011A Strain of muscle and tendon of front wall of thorax, initial encounter: Secondary | ICD-10-CM | POA: Insufficient documentation

## 2018-03-28 DIAGNOSIS — Z0001 Encounter for general adult medical examination with abnormal findings: Secondary | ICD-10-CM | POA: Diagnosis not present

## 2018-03-28 LAB — COMPREHENSIVE METABOLIC PANEL
ALK PHOS: 60 U/L (ref 39–117)
ALT: 23 U/L (ref 0–53)
AST: 20 U/L (ref 0–37)
Albumin: 4 g/dL (ref 3.5–5.2)
BILIRUBIN TOTAL: 0.5 mg/dL (ref 0.2–1.2)
BUN: 17 mg/dL (ref 6–23)
CALCIUM: 9.1 mg/dL (ref 8.4–10.5)
CO2: 30 mEq/L (ref 19–32)
CREATININE: 1.11 mg/dL (ref 0.40–1.50)
Chloride: 105 mEq/L (ref 96–112)
GFR: 89.17 mL/min (ref >60.00)
Glucose, Bld: 102 mg/dL — ABNORMAL HIGH (ref 70–99)
Potassium: 4.6 mEq/L (ref 3.5–5.1)
Sodium: 141 mEq/L (ref 135–145)
TOTAL PROTEIN: 6.4 g/dL (ref 6.0–8.3)

## 2018-03-28 LAB — LIPID PANEL
CHOLESTEROL: 132 mg/dL (ref 0–200)
HDL: 38.1 mg/dL — ABNORMAL LOW (ref >39.00)
LDL CALC: 67 mg/dL (ref 0–99)
NonHDL: 93.68
TRIGLYCERIDES: 134 mg/dL (ref 0.0–149.0)
Total CHOL/HDL Ratio: 3
VLDL: 26.8 mg/dL (ref 0.0–40.0)

## 2018-03-28 LAB — URINALYSIS, ROUTINE W REFLEX MICROSCOPIC
BILIRUBIN URINE: NEGATIVE
Hgb urine dipstick: NEGATIVE
KETONES UR: NEGATIVE
LEUKOCYTES UA: NEGATIVE
NITRITE: NEGATIVE
RBC / HPF: NONE SEEN (ref 0–?)
Specific Gravity, Urine: 1.025 (ref 1.000–1.030)
URINE GLUCOSE: NEGATIVE
UROBILINOGEN UA: 0.2 (ref 0.0–1.0)
pH: 6 (ref 5.0–8.0)

## 2018-03-28 LAB — CBC
HCT: 39.9 % (ref 39.0–52.0)
HEMOGLOBIN: 13.2 g/dL (ref 13.0–17.0)
MCHC: 33.1 g/dL (ref 30.0–36.0)
MCV: 92.5 fl (ref 78.0–100.0)
PLATELETS: 231 10*3/uL (ref 150.0–400.0)
RBC: 4.32 Mil/uL (ref 4.22–5.81)
RDW: 17.9 % — ABNORMAL HIGH (ref 11.5–15.5)
WBC: 7.9 10*3/uL (ref 4.0–10.5)

## 2018-03-28 LAB — PSA: PSA: 5.43 ng/mL — ABNORMAL HIGH (ref 0.10–4.00)
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
## 2018-03-28 NOTE — Patient Instructions (Signed)

## 2018-03-29 DIAGNOSIS — R972 Elevated prostate specific antigen [PSA]: Secondary | ICD-10-CM | POA: Insufficient documentation

## 2018-03-29 LAB — HIV ANTIBODY (ROUTINE TESTING W REFLEX): HIV 1&2 Ab, 4th Generation: NONREACTIVE

## 2018-03-29 NOTE — Addendum Note (Signed)
Addended by: Jon Billings on: 03/29/2018 09:45 AM   Modules accepted: Orders

## 2018-04-25 DIAGNOSIS — R972 Elevated prostate specific antigen [PSA]: Secondary | ICD-10-CM | POA: Diagnosis not present

## 2018-04-25 DIAGNOSIS — N400 Enlarged prostate without lower urinary tract symptoms: Secondary | ICD-10-CM | POA: Diagnosis not present

## 2018-05-31 DIAGNOSIS — R972 Elevated prostate specific antigen [PSA]: Secondary | ICD-10-CM | POA: Diagnosis not present

## 2018-06-07 DIAGNOSIS — R972 Elevated prostate specific antigen [PSA]: Secondary | ICD-10-CM | POA: Diagnosis not present

## 2018-06-07 DIAGNOSIS — N400 Enlarged prostate without lower urinary tract symptoms: Secondary | ICD-10-CM | POA: Diagnosis not present

## 2018-10-04 LAB — BASIC METABOLIC PANEL
BUN: 13 (ref 4–21)
CREATININE: 1.1 (ref 0.6–1.3)
Glucose: 102
POTASSIUM: 4.9 (ref 3.4–5.3)
SODIUM: 137 (ref 137–147)

## 2018-10-04 LAB — HEPATIC FUNCTION PANEL
ALT: 21 (ref 10–40)
AST: 23 (ref 14–40)
Alkaline Phosphatase: 72 (ref 25–125)
Bilirubin, Total: 0.8

## 2018-10-04 LAB — LIPID PANEL
CHOLESTEROL: 152 (ref 0–200)
HDL: 53 (ref 35–70)
LDL Cholesterol: 84
TRIGLYCERIDES: 75 (ref 40–160)

## 2018-10-04 LAB — HEMOGLOBIN A1C: HEMOGLOBIN A1C: 6.1

## 2018-10-29 ENCOUNTER — Encounter: Payer: Self-pay | Admitting: Family Medicine

## 2018-10-29 ENCOUNTER — Ambulatory Visit (INDEPENDENT_AMBULATORY_CARE_PROVIDER_SITE_OTHER): Payer: BLUE CROSS/BLUE SHIELD | Admitting: Family Medicine

## 2018-10-29 VITALS — BP 128/80 | HR 65 | Ht 71.0 in | Wt 184.2 lb

## 2018-10-29 DIAGNOSIS — R7303 Prediabetes: Secondary | ICD-10-CM | POA: Diagnosis not present

## 2018-10-29 DIAGNOSIS — H9113 Presbycusis, bilateral: Secondary | ICD-10-CM | POA: Diagnosis not present

## 2018-10-29 NOTE — Patient Instructions (Signed)
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
## 2019-01-08 ENCOUNTER — Ambulatory Visit: Payer: BLUE CROSS/BLUE SHIELD | Admitting: Family Medicine

## 2019-01-08 ENCOUNTER — Ambulatory Visit: Payer: Self-pay

## 2019-01-08 ENCOUNTER — Encounter: Payer: Self-pay | Admitting: Family Medicine

## 2019-01-08 VITALS — BP 138/76 | HR 65 | Temp 98.2°F | Ht 71.0 in | Wt 182.0 lb

## 2019-01-08 DIAGNOSIS — R202 Paresthesia of skin: Secondary | ICD-10-CM | POA: Diagnosis not present

## 2019-01-08 LAB — BASIC METABOLIC PANEL
BUN: 18 mg/dL (ref 6–23)
CALCIUM: 9 mg/dL (ref 8.4–10.5)
CO2: 29 mEq/L (ref 19–32)
Chloride: 105 mEq/L (ref 96–112)
Creatinine, Ser: 1.08 mg/dL (ref 0.40–1.50)
GFR: 86.33 mL/min (ref >60.00)
GLUCOSE: 98 mg/dL (ref 70–99)
Potassium: 4 mEq/L (ref 3.5–5.1)
Sodium: 141 mEq/L (ref 135–145)

## 2019-01-08 LAB — VITAMIN B12: Vitamin B-12: 555 pg/mL (ref 211–911)

## 2019-01-08 LAB — HEMOGLOBIN A1C: Hgb A1c MFr Bld: 6.3 % (ref 4.6–6.5)
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
## 2019-01-08 NOTE — Patient Instructions (Signed)
Paresthesia Paresthesia is a burning or prickling feeling. This feeling can happen in any part of the body. It often happens in the hands, arms, legs, or feet. Usually, it is not painful. In most cases, the feeling goes away in a short time and is not a sign of a serious problem. If you have paresthesia that lasts a long time, you may need to be seen by your doctor. Follow these instructions at home: Alcohol use   Do not drink alcohol if: ? Your doctor tells you not to drink. ? You are pregnant, may be pregnant, or are planning to become pregnant.  If you drink alcohol, limit how much you have: ? 0-1 drink a day for women. ? 0-2 drinks a day for men.  Be aware of how much alcohol is in your drink. In the [place] one drink equals one typical bottle of beer (12 oz), one-half glass of wine (5 oz), or one shot of hard liquor (1 oz). Nutrition  Eat a healthy diet. This includes: ? Eating foods that have a lot of fiber in them, such as fresh fruits and vegetables, whole grains, and beans. ? Limiting foods that have a lot of fat and processed sugars in them, such as fried or sweet foods. General instructions  Take over-the-counter and prescription medicines only as told by your doctor.  Do not use any products that have nicotine or tobacco in them, such as cigarettes and e-cigarettes. If you need help quitting, ask your doctor.  If you have diabetes, work with your doctor to make sure your blood sugar stays in a healthy range.  If your feet feel numb: ? Check for redness, warmth, and swelling every day. ? Wear padded socks and comfortable shoes. These help protect your feet.  Keep all follow-up visits as told by your doctor. This is important. Contact a doctor if:  You have paresthesia that gets worse or does not go away.  Your burning or prickling feeling gets worse when you walk.  You have pain or cramps.  You feel dizzy.  You have a rash. Get help right away if you:  Feel  weak.  Have trouble walking or moving.  Have problems speaking, understanding, or seeing.  Feel confused.  Cannot control when you pee (urinate) or poop (have a bowel movement).  Lose feeling (have numbness) after an injury.  Have new weakness in an arm or leg.  Pass out (faint). Summary  Paresthesia is a burning or prickling feeling. It often happens in the hands, arms, legs, or feet.  In most cases, the feeling goes away in a short time and is not a sign of a serious problem.  If you have paresthesia that lasts a long time, you may need to be seen by your doctor. This information is not intended to replace advice given to you by your health care provider. Make sure you discuss any questions you have with your health care provider. Document Released: 10/20/2008 Document Revised: 11/16/2017 Document Reviewed: 11/16/2017 Elsevier Interactive Patient Education  2019 [employer]vier Inc.  

## 2019-01-08 NOTE — Assessment & Plan Note (Addendum)
Orders Placed This Encounter  Procedures  . Basic Metabolic Panel (BMET)  . HgB A1c  . B12  New problem Update labs today as he has history of prediabetes, discussed how this may be contributing.   Discussed being sure to wear supportive , well cushioned shoes.

## 2019-01-08 NOTE — Telephone Encounter (Signed)
Phone call rec'd from pt.  C/o onset of numbness and tingling of left foot yesterday approx. 11:30 AM.  Reported he had been working on a project and was in a different position for approx. 20 min., and noticed the numbness of the foot, when he finished.  Reported the numbness/ tingling has continued in the left foot up to ankle region.  Reported able to move his foot and toes without difficulty.  Left foot is warm to touch, color normal.  Denied any pain or swelling.  Denied any numbness/ tingling in the left arm or left leg. Denied changes in speech or vision, dizziness, balance problems, or headache.  Appt. Scheduled today at 2:00 PM at PCP office.  Advised if symptoms worsen; ie: numbness/ tingling increased to left arm or leg, speech change, vision change, or if left foot color changes or foot temperature becomes cool/ cold, to go to the ER.  Pt. Verb. Understanding; agreed with plan.           Reason for Disposition . [1] Tingling (e.g., pins and needles) of the face, arm / hand, or leg / foot on one side of the body AND [2] present now  Answer Assessment - Initial Assessment Questions 1. SYMPTOM: "What is the main symptom you are concerned about?" (e.g., weakness, numbness)     Numbness/ tingling of left foot up to ankle   2. ONSET: "When did this start?" (minutes, hours, days; while sleeping)     Started on 2/17 @ 11:30 AM  3. LAST NORMAL: "When was the last time you were normal (no symptoms)?"     Prior to the onset 4. PATTERN "Does this come and go, or has it been constant since it started?"  "Is it present now?"     Constant  5. CARDIAC SYMPTOMS: "Have you had any of the following symptoms: chest pain, difficulty breathing, palpitations?"     Denied the above  6. NEUROLOGIC SYMPTOMS: "Have you had any of the following symptoms: headache, dizziness, vision loss, double vision, changes in speech, unsteady on your feet?"     Denied numbness of left arm or left leg ; denied balance problems;  denied any speech change; denied headache, dizziness, or any vision change   7. OTHER SYMPTOMS: "Do you have any other symptoms?"     Denied any swelling of left foot/ lower extremity 8. PREGNANCY: "Is there any chance you are pregnant?" "When was your last menstrual period?"    N/a  Protocols used: NEUROLOGIC DEFICIT-A-AH

## 2019-01-11 NOTE — Progress Notes (Signed)
A1c is a little higher than previous check.  Focus on low carb diet and regular exercise.  Other labs are normal.

## 2019-01-19 ENCOUNTER — Encounter: Payer: Self-pay | Admitting: Gastroenterology

## 2019-01-28 ENCOUNTER — Ambulatory Visit: Payer: BLUE CROSS/BLUE SHIELD | Admitting: Family Medicine

## 2019-03-04 ENCOUNTER — Telehealth: Payer: Self-pay | Admitting: Family Medicine

## 2019-03-04 NOTE — Telephone Encounter (Signed)
Tried to call and reschedule last appt that was canceled for a virtual visit, left message.

## 2019-04-01 DIAGNOSIS — F438 Other reactions to severe stress: Secondary | ICD-10-CM | POA: Diagnosis not present

## 2019-04-01 DIAGNOSIS — F419 Anxiety disorder, unspecified: Secondary | ICD-10-CM | POA: Diagnosis not present

## 2019-04-09 DIAGNOSIS — F419 Anxiety disorder, unspecified: Secondary | ICD-10-CM | POA: Diagnosis not present

## 2019-04-09 DIAGNOSIS — F438 Other reactions to severe stress: Secondary | ICD-10-CM | POA: Diagnosis not present

## 2019-04-30 ENCOUNTER — Ambulatory Visit: Payer: BLUE CROSS/BLUE SHIELD | Admitting: Family Medicine

## 2019-08-23 ENCOUNTER — Telehealth: Payer: Self-pay

## 2019-08-23 NOTE — Telephone Encounter (Signed)

## 2019-08-26 ENCOUNTER — Encounter: Payer: Self-pay | Admitting: Family Medicine

## 2019-08-26 ENCOUNTER — Ambulatory Visit: Payer: BC Managed Care – PPO | Admitting: Family Medicine

## 2019-08-26 ENCOUNTER — Other Ambulatory Visit: Payer: Self-pay

## 2019-08-26 VITALS — BP 136/80 | HR 79 | Ht 71.0 in | Wt 198.0 lb

## 2019-08-26 DIAGNOSIS — R972 Elevated prostate specific antigen [PSA]: Secondary | ICD-10-CM | POA: Diagnosis not present

## 2019-08-26 DIAGNOSIS — Z0000 Encounter for general adult medical examination without abnormal findings: Secondary | ICD-10-CM

## 2019-08-26 NOTE — Patient Instructions (Signed)
Preventive Care 40-54 Years Old, Male °Preventive care refers to lifestyle choices and visits with your health care provider that can promote health and wellness. This includes: °· A yearly physical exam. This is also called an annual well check. °· Regular dental and eye exams. °· Immunizations. °· Screening for certain conditions. °· Healthy lifestyle choices, such as eating a healthy diet, getting regular exercise, not using drugs or products that contain nicotine and tobacco, and limiting alcohol use. °What can I expect for my preventive care visit? °Physical exam °Your health care provider will check: °· Height and weight. These may be used to calculate body mass index (BMI), which is a measurement that tells if you are at a healthy weight. °· Heart rate and blood pressure. °· Your skin for abnormal spots. °Counseling °Your health care provider may ask you questions about: °· Alcohol, tobacco, and drug use. °· Emotional well-being. °· Home and relationship well-being. °· Sexual activity. °· Eating habits. °· Work and work environment. °What immunizations do I need? ° °Influenza (flu) vaccine °· This is recommended every year. °Tetanus, diphtheria, and pertussis (Tdap) vaccine °· You may need a Td booster every 10 years. °Varicella (chickenpox) vaccine °· You may need this vaccine if you have not already been vaccinated. °Zoster (shingles) vaccine °· You may need this after age 60. °Measles, mumps, and rubella (MMR) vaccine °· You may need at least one dose of MMR if you were born in 1957 or later. You may also need a second dose. °Pneumococcal conjugate (PCV13) vaccine °· You may need this if you have certain conditions and were not previously vaccinated. °Pneumococcal polysaccharide (PPSV23) vaccine °· You may need one or two doses if you smoke cigarettes or if you have certain conditions. °Meningococcal conjugate (MenACWY) vaccine °· You may need this if you have certain conditions. °Hepatitis A  vaccine °· You may need this if you have certain conditions or if you travel or work in places where you may be exposed to hepatitis A. °Hepatitis B vaccine °· You may need this if you have certain conditions or if you travel or work in places where you may be exposed to hepatitis B. °Haemophilus influenzae type b (Hib) vaccine °· You may need this if you have certain risk factors. °Human papillomavirus (HPV) vaccine °· If recommended by your health care provider, you may need three doses over 6 months. °You may receive vaccines as individual doses or as more than one vaccine together in one shot (combination vaccines). Talk with your health care provider about the risks and benefits of combination vaccines. °What tests do I need? °Blood tests °· Lipid and cholesterol levels. These may be checked every 5 years, or more frequently if you are over 50 years old. °· Hepatitis C test. °· Hepatitis B test. °Screening °· Lung cancer screening. You may have this screening every year starting at age 55 if you have a 30-pack-year history of smoking and currently smoke or have quit within the past 15 years. °· Prostate cancer screening. Recommendations will vary depending on your family history and other risks. °· Colorectal cancer screening. All adults should have this screening starting at age 50 and continuing until age 75. Your health care provider may recommend screening at age 45 if you are at increased risk. You will have tests every 1-10 years, depending on your results and the type of screening test. °· Diabetes screening. This is done by checking your blood sugar (glucose) after you have not eaten   for a while (fasting). You may have this done every 1-3 years.  Sexually transmitted disease (STD) testing. Follow these instructions at home: Eating and drinking  Eat a diet that includes fresh fruits and vegetables, whole grains, lean protein, and low-fat dairy products.  Take vitamin and mineral supplements as  recommended by your health care provider.  Do not drink alcohol if your health care provider tells you not to drink.  If you drink alcohol: ? Limit how much you have to 0-2 drinks a day. ? Be aware of how much alcohol is in your drink. In the [place] one drink equals one 12 oz bottle of beer (355 mL), one 5 oz glass of wine (148 mL), or one 1 oz glass of hard liquor (44 mL). Lifestyle  Take daily care of your teeth and gums.  Stay active. Exercise for at least 30 minutes on 5 or more days each week.  Do not use any products that contain nicotine or tobacco, such as cigarettes, e-cigarettes, and chewing tobacco. If you need help quitting, ask your health care provider.  If you are sexually active, practice safe sex. Use a condom or other form of protection to prevent STIs (sexually transmitted infections).  Talk with your health care provider about taking a low-dose aspirin every day starting at age 42. What's next?  Go to your health care provider once a year for a well check visit.  Ask your health care provider how often you should have your eyes and teeth checked.  Stay up to date on all vaccines. This information is not intended to replace advice given to you by your health care provider. Make sure you discuss any questions you have with your health care provider. Document Released: 12/04/2015 Document Revised: 11/01/2018 Document Reviewed: 11/01/2018 Elsevier Patient Education  2020 [place] Maintenance, Male Adopting a healthy lifestyle and getting preventive care are important in promoting health and wellness. Ask your health care provider about:  The right schedule for you to have regular tests and exams.  Things you can do on your own to prevent diseases and keep yourself healthy. What should I know about diet, weight, and exercise? Eat a healthy diet   Eat a diet that includes plenty of vegetables, fruits, low-fat dairy products, and lean protein.  Do  not eat a lot of foods that are high in solid fats, added sugars, or sodium. Maintain a healthy weight Body mass index (BMI) is a measurement that can be used to identify possible weight problems. It estimates body fat based on height and weight. Your health care provider can help determine your BMI and help you achieve or maintain a healthy weight. Get regular exercise Get regular exercise. This is one of the most important things you can do for your health. Most adults should:  Exercise for at least 150 minutes each week. The exercise should increase your heart rate and make you sweat (moderate-intensity exercise).  Do strengthening exercises at least twice a week. This is in addition to the moderate-intensity exercise.  Spend less time sitting. Even light physical activity can be beneficial. Watch cholesterol and blood lipids Have your blood tested for lipids and cholesterol at 54 years of age, then have this test every 5 years. You may need to have your cholesterol levels checked more often if:  Your lipid or cholesterol levels are high.  You are older than 54 years of age.  You are at high risk for heart disease. What should  I know about cancer screening? Many types of cancers can be detected early and may often be prevented. Depending on your health history and family history, you may need to have cancer screening at various ages. This may include screening for:  Colorectal cancer.  Prostate cancer.  Skin cancer.  Lung cancer. What should I know about heart disease, diabetes, and high blood pressure? Blood pressure and heart disease  High blood pressure causes heart disease and increases the risk of stroke. This is more likely to develop in people who have high blood pressure readings, are of African descent, or are overweight.  Talk with your health care provider about your target blood pressure readings.  Have your blood pressure checked: ? Every 3-5 years if you are  67-10 years of age. ? Every year if you are 37 years old or older.  If you are between the ages of 45 and 77 and are a current or former smoker, ask your health care provider if you should have a one-time screening for abdominal aortic aneurysm (AAA). Diabetes Have regular diabetes screenings. This checks your fasting blood sugar level. Have the screening done:  Once every three years after age 77 if you are at a normal weight and have a low risk for diabetes.  More often and at a younger age if you are overweight or have a high risk for diabetes. What should I know about preventing infection? Hepatitis B If you have a higher risk for hepatitis B, you should be screened for this virus. Talk with your health care provider to find out if you are at risk for hepatitis B infection. Hepatitis C Blood testing is recommended for:  Everyone born from [birth year] through 1966.  Anyone with known risk factors for hepatitis C. Sexually transmitted infections (STIs)  You should be screened each year for STIs, including gonorrhea and chlamydia, if: ? You are sexually active and are younger than 54 years of age. ? You are older than 53 years of age and your health care provider tells you that you are at risk for this type of infection. ? Your sexual activity has changed since you were last screened, and you are at increased risk for chlamydia or gonorrhea. Ask your health care provider if you are at risk.  Ask your health care provider about whether you are at high risk for HIV. Your health care provider may recommend a prescription medicine to help prevent HIV infection. If you choose to take medicine to prevent HIV, you should first get tested for HIV. You should then be tested every 3 months for as long as you are taking the medicine. Follow these instructions at home: Lifestyle  Do not use any products that contain nicotine or tobacco, such as cigarettes, e-cigarettes, and chewing tobacco. If you need  help quitting, ask your health care provider.  Do not use street drugs.  Do not share needles.  Ask your health care provider for help if you need support or information about quitting drugs. Alcohol use  Do not drink alcohol if your health care provider tells you not to drink.  If you drink alcohol: ? Limit how much you have to 0-2 drinks a day. ? Be aware of how much alcohol is in your drink. In the [place] one drink equals one 12 oz bottle of beer (355 mL), one 5 oz glass of wine (148 mL), or one 1 oz glass of hard liquor (44 mL). General instructions  Schedule regular health, dental,  and eye exams.  Stay current with your vaccines.  Tell your health care provider if: ? You often feel depressed. ? You have ever been abused or do not feel safe at home. Summary  Adopting a healthy lifestyle and getting preventive care are important in promoting health and wellness.  Follow your health care provider's instructions about healthy diet, exercising, and getting tested or screened for diseases.  Follow your health care provider's instructions on monitoring your cholesterol and blood pressure. This information is not intended to replace advice given to you by your health care provider. Make sure you discuss any questions you have with your health care provider. Document Released: 05/05/2008 Document Revised: 10/31/2018 Document Reviewed: 10/31/2018 Elsevier Patient Education  [place].  Preventing Hypertension Hypertension, commonly called high blood pressure, is when the force of blood pumping through the arteries is too strong. Arteries are blood vessels that carry blood from the heart throughout the body. Over time, hypertension can damage the arteries and decrease blood flow to important parts of the body, including the brain, heart, and kidneys. Often, hypertension does not cause symptoms until blood pressure is very high. For this reason, it is important to have your blood  pressure checked on a regular basis. Hypertension can often be prevented with diet and lifestyle changes. If you already have hypertension, you can control it with diet and lifestyle changes, as well as medicine. What nutrition changes can be made? Maintain a healthy diet. This includes:  Eating less salt (sodium). Ask your health care provider how much sodium is safe for you to have. The general recommendation is to consume less than 1 tsp (2,300 mg) of sodium a day. ? Do not add salt to your food. ? Choose low-sodium options when grocery shopping and eating out.  Limiting fats in your diet. You can do this by eating low-fat or fat-free dairy products and by eating less red meat.  Eating more fruits, vegetables, and whole grains. Make a goal to eat: ? 1-2 cups of fresh fruits and vegetables each day. ? 3-4 servings of whole grains each day.  Avoiding foods and beverages that have added sugars.  Eating fish that contain healthy fats (omega-3 fatty acids), such as mackerel or salmon. If you need help putting together a healthy eating plan, try the DASH diet. This diet is high in fruits, vegetables, and whole grains. It is low in sodium, red meat, and added sugars. DASH stands for Dietary Approaches to Stop Hypertension. What lifestyle changes can be made?   Lose weight if you are overweight. Losing just 3?5% of your body weight can help prevent or control hypertension. ? For example, if your present weight is 200 lb (91 kg), a loss of 3-5% of your weight means losing 6-10 lb (2.7-4.5 kg). ? Ask your health care provider to help you with a diet and exercise plan to safely lose weight.  Get enough exercise. Do at least 150 minutes of moderate-intensity exercise each week. ? You could do this in short exercise sessions several times a day, or you could do longer exercise sessions a few times a week. For example, you could take a brisk 10-minute walk or bike ride, 3 times a day, for 5 days a  week.  Find ways to reduce stress, such as exercising, meditating, listening to music, or taking a yoga class. If you need help reducing stress, ask your health care provider.  Do not smoke. This includes e-cigarettes. Chemicals in tobacco and  nicotine products raise your blood pressure each time you smoke. If you need help quitting, ask your health care provider.  Avoid alcohol. If you drink alcohol, limit alcohol intake to no more than 1 drink a day for nonpregnant women and 2 drinks a day for men. One drink equals 12 oz of beer, 5 oz of wine, or 1 oz of hard liquor. Why are these changes important? Diet and lifestyle changes can help you prevent hypertension, and they may make you feel better overall and improve your quality of life. If you have hypertension, making these changes will help you control it and help prevent major complications, such as:  Hardening and narrowing of arteries that supply blood to: ? Your heart. This can cause a heart attack. ? Your brain. This can cause a stroke. ? Your kidneys. This can cause kidney failure.  Stress on your heart muscle, which can cause heart failure. What can I do to lower my risk?  Work with your health care provider to make a hypertension prevention plan that works for you. Follow your plan and keep all follow-up visits as told by your health care provider.  Learn how to check your blood pressure at home. Make sure that you know your personal target blood pressure, as told by your health care provider. How is this treated? In addition to diet and lifestyle changes, your health care provider may recommend medicines to help lower your blood pressure. You may need to try a few different medicines to find what works best for you. You also may need to take more than one medicine. Take over-the-counter and prescription medicines only as told by your health care provider. Where to find support Your health care provider can help you prevent  hypertension and help you keep your blood pressure at a healthy level. Your local hospital or your community may also provide support services and prevention programs. The American Heart Association offers an online support network at: CheapBootlegs.com.cy Where to find more information Learn more about hypertension from:  [place], [facility], and Blood Institute: ElectronicHangman.is  Centers for Disease Control and Prevention: https://ingram.com/  American Academy of Family Physicians: http://familydoctor.org/familydoctor/en/diseases-conditions/high-blood-pressure.printerview.all.html Learn more about the DASH diet from:  [place], Lung, and [place]: https://www.reyes.com/ Contact a health care provider if:  You think you are having a reaction to medicines you have taken.  You have recurrent headaches or feel dizzy.  You have swelling in your ankles.  You have trouble with your vision. Summary  Hypertension often does not cause any symptoms until blood pressure is very high. It is important to get your blood pressure checked regularly.  Diet and lifestyle changes are the most important steps in preventing hypertension.  By keeping your blood pressure in a healthy range, you can prevent complications like heart attack, heart failure, stroke, and kidney failure.  Work with your health care provider to make a hypertension prevention plan that works for you. This information is not intended to replace advice given to you by your health care provider. Make sure you discuss any questions you have with your health care provider. Document Released: 11/22/2015 Document Revised: 03/01/2019 Document Reviewed: 07/18/2016 Elsevier Patient Education  2020 [employer].

## 2019-08-26 NOTE — Progress Notes (Signed)
Established Patient Office Visit  Subjective:  Patient ID: William Jacobson, male    DOB: [birth year]  Age: 53 y.o. MRN: [id number]  CC:  Chief Complaint  Patient presents with   Hypertension    elevated bp at dentist office    HPI William Jacobson presents for a physical exam and follow-up of his blood pressure that has been elevated this past week when he saw his dentist for tooth pain.  Patient has no history of high blood pressure.  He is seen multiple times at this clinic with normal blood pressure.  He did say that he was in a great deal of pain.  Patient does not smoke or use illicit drug use and rarely drinks.  He did say that he had been drinking the weekend before his visit to his dentist while [place] was [place].  Regarding his elevated PSA,  he did say that he saw the urologist who performed a biopsy and told him that everything was okay.  Patient denies decreased urine flow or nocturia.  Patient says that he was not scheduled for follow-up.  He has had no headaches or blurred vision.  Past Medical History:  Diagnosis Date   Contact dermatitis and other eczema, due to unspecified cause    Pain in joint, lower leg    pain in right knee- torn minisicus lateral   Personal history of other diseases of digestive system    history of hemorrhoids    Past Surgical History:  Procedure Laterality Date   KNEE ARTHROSCOPY  2008   arthroscopy right knee torn laterlal  miniscus    Family History  Problem Relation Age of Onset   Diabetes Mother    Healthy Father    Heart attack Maternal Grandmother    Alcohol abuse Maternal Grandfather    Cirrhosis Maternal Grandfather    Colon cancer Neg Hx     Social History   Socioeconomic History   Marital status: Married    Spouse name: Not on file   Number of children: 3   Years of education: 16   Highest education level: Not on file  Occupational History   Occupation: Teacher, early years/pre    Social Designer, fashion/clothing strain: Not on file   Food insecurity    Worry: Not on file    Inability: Not on file   Transportation needs    Medical: Not on file    Non-medical: Not on file  Tobacco Use   Smoking status: Never Smoker   Smokeless tobacco: Never Used  Substance and Sexual Activity   Alcohol use: Yes    Alcohol/week: 0.0 standard drinks    Comment: Occasionally   Drug use: No   Sexual activity: Yes    Partners: Female  Lifestyle   Physical activity    Days per week: Not on file    Minutes per session: Not on file   Stress: Not on file  Relationships   Social connections    Talks on phone: Not on file    Gets together: Not on file    Attends religious service: Not on file    Active member of club or organization: Not on file    Attends meetings of clubs or organizations: Not on file    Relationship status: Not on file   Intimate partner violence    Fear of current or ex partner: Not on file    Emotionally abused: Not on file  Physically abused: Not on file    Forced sexual activity: Not on file  Other Topics Concern   Not on file  Social History Narrative   HSG, [employer]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 MARRIAGE- HITTING A LITTLE TOUGH SPOT RE: MONEY, PARENTING DIFFICULTIES WITH HIS OLDER SON FROM PREVIOUS MARRIAGE    No outpatient medications prior to visit.   No facility-administered medications prior to visit.     No Known Allergies  ROS Review of Systems  Constitutional: Negative.   HENT: Negative.   Eyes: Negative for photophobia and visual disturbance.  Respiratory: Negative.   Cardiovascular: Negative.   Gastrointestinal: Negative.   Endocrine: Negative for polyphagia and polyuria.  Genitourinary: Negative for difficulty urinating, frequency, hematuria and urgency.  Musculoskeletal: Negative for gait problem and joint swelling.  Skin: Negative for pallor.   Allergic/Immunologic: Negative for immunocompromised state.  Neurological: Negative for light-headedness and headaches.  Hematological: Does not bruise/bleed easily.  Psychiatric/Behavioral: Negative.       Objective:    Physical Exam  Constitutional: He is oriented to person, place, and time. He appears well-developed and well-nourished. No distress.  HENT:  Head: Normocephalic and atraumatic.  Right Ear: External ear normal.  Left Ear: External ear normal.  Mouth/Throat: Oropharynx is clear and moist. No oropharyngeal exudate.  Eyes: Pupils are equal, round, and reactive to light. Conjunctivae are normal. Right eye exhibits no discharge. Left eye exhibits no discharge. No scleral icterus.  Neck: Neck supple. No JVD present. No tracheal deviation present. No thyromegaly present.  Cardiovascular: Normal rate, regular rhythm and normal heart sounds.  Pulmonary/Chest: Effort normal and breath sounds normal. No stridor.  Abdominal: Bowel sounds are normal.  Genitourinary: Rectum:     Guaiac result negative.     No rectal mass, anal fissure, tenderness, external hemorrhoid, internal hemorrhoid or abnormal anal tone.  Prostate is enlarged. Prostate is not tender.  Musculoskeletal:        General: No edema.  Lymphadenopathy:    He has no cervical adenopathy.  Neurological: He is alert and oriented to person, place, and time.  Skin: Skin is warm and dry. He is not diaphoretic.  Psychiatric: He has a normal mood and affect. His behavior is normal.    BP 136/80    Pulse 79    Ht 5\' 11"  (1.803 m)    Wt 198 lb (89.8 kg)    SpO2 99%    BMI 27.62 kg/m  Wt Readings from Last 3 Encounters:  08/26/19 198 lb (89.8 kg)  01/08/19 182 lb (82.6 kg)  10/29/18 184 lb 4 oz (83.6 kg)   BP Readings from Last 3 Encounters:  08/26/19 136/80  01/08/19 138/76  10/29/18 128/80   Guideline developer:  UpToDate (see UpToDate for funding source) Date Released: June 2014  Health Maintenance Due   Topic Date Due   COLONOSCOPY  12/31/2018   INFLUENZA VACCINE  06/22/2019    There are no preventive care reminders to display for this patient.  Lab Results  Component Value Date   TSH 1.22 06/10/2010   Lab Results  Component Value Date   WBC 7.9 03/28/2018   HGB 13.2 03/28/2018   HCT 39.9 03/28/2018   MCV 92.5 03/28/2018   PLT 231.0 03/28/2018   Lab Results  Component Value Date   NA 141 01/08/2019   K 4.0 01/08/2019   CO2 29 01/08/2019   GLUCOSE 98 01/08/2019  BUN 18 01/08/2019   CREATININE 1.08 01/08/2019   BILITOT 0.5 03/28/2018   ALKPHOS 72 10/04/2018   AST 23 10/04/2018   ALT 21 10/04/2018   PROT 6.4 03/28/2018   ALBUMIN 4.0 03/28/2018   CALCIUM 9.0 01/08/2019   GFR 86.33 01/08/2019   Lab Results  Component Value Date   CHOL 152 10/04/2018   Lab Results  Component Value Date   HDL 53 10/04/2018   Lab Results  Component Value Date   LDLCALC 84 10/04/2018   Lab Results  Component Value Date   TRIG 75 10/04/2018   Lab Results  Component Value Date   CHOLHDL 3 03/28/2018   Lab Results  Component Value Date   HGBA1C 6.3 01/08/2019      Assessment & Plan:   Problem List Items Addressed This Visit      Other   Healthcare maintenance - Primary   Relevant Orders   Comprehensive metabolic panel   CBC   Lipid panel   PSA   Urinalysis, Routine w reflex microscopic   Elevated PSA   Relevant Orders   PSA      No orders of the defined types were placed in this encounter.   Follow-up: Return in about 3 months (around 11/26/2019).   Patient was given information on health maintenance and disease prevention.  He was also given information on preventing hypertension.  Checking PSA.  Refer back to urology with elevated PSA.  Patient refuses flu vaccine today.  He will return fasting for above ordered blood work.  Reminded patient that he is due for follow-up colonoscopy.

## 2019-10-01 ENCOUNTER — Other Ambulatory Visit: Payer: Self-pay | Admitting: Family Medicine

## 2019-10-01 DIAGNOSIS — Z20822 Contact with and (suspected) exposure to covid-19: Secondary | ICD-10-CM

## 2019-10-02 ENCOUNTER — Other Ambulatory Visit: Payer: Self-pay

## 2019-10-02 DIAGNOSIS — Z20822 Contact with and (suspected) exposure to covid-19: Secondary | ICD-10-CM

## 2019-10-03 LAB — NOVEL CORONAVIRUS, NAA: SARS-CoV-2, NAA: NOT DETECTED

## 2019-11-01 ENCOUNTER — Ambulatory Visit (INDEPENDENT_AMBULATORY_CARE_PROVIDER_SITE_OTHER): Payer: BC Managed Care – PPO | Admitting: Family Medicine

## 2019-11-01 ENCOUNTER — Encounter: Payer: Self-pay | Admitting: Family Medicine

## 2019-11-01 ENCOUNTER — Other Ambulatory Visit: Payer: Self-pay

## 2019-11-01 VITALS — BP 132/80 | HR 50 | Temp 97.8°F | Ht 71.0 in | Wt 196.8 lb

## 2019-11-01 DIAGNOSIS — Z0000 Encounter for general adult medical examination without abnormal findings: Secondary | ICD-10-CM | POA: Diagnosis not present

## 2019-11-01 DIAGNOSIS — R7303 Prediabetes: Secondary | ICD-10-CM

## 2019-11-01 DIAGNOSIS — R972 Elevated prostate specific antigen [PSA]: Secondary | ICD-10-CM

## 2019-11-01 DIAGNOSIS — E790 Hyperuricemia without signs of inflammatory arthritis and tophaceous disease: Secondary | ICD-10-CM

## 2019-11-01 LAB — URINALYSIS, ROUTINE W REFLEX MICROSCOPIC
Bilirubin Urine: NEGATIVE
Hgb urine dipstick: NEGATIVE
Ketones, ur: NEGATIVE
Leukocytes,Ua: NEGATIVE
Nitrite: NEGATIVE
RBC / HPF: NONE SEEN (ref 0–?)
Specific Gravity, Urine: 1.02 (ref 1.000–1.030)
Total Protein, Urine: NEGATIVE
Urine Glucose: NEGATIVE
Urobilinogen, UA: 0.2 (ref 0.0–1.0)
WBC, UA: NONE SEEN (ref 0–?)
pH: 6.5 (ref 5.0–8.0)

## 2019-11-01 LAB — LIPID PANEL
Cholesterol: 186 mg/dL (ref 0–200)
HDL: 45.9 mg/dL (ref >39.00)
LDL Cholesterol: 128 mg/dL — ABNORMAL HIGH (ref 0–99)
NonHDL: 140.26
Total CHOL/HDL Ratio: 4
Triglycerides: 63 mg/dL (ref 0.0–149.0)
VLDL: 12.6 mg/dL (ref 0.0–40.0)

## 2019-11-01 LAB — COMPREHENSIVE METABOLIC PANEL
ALT: 25 U/L (ref 0–53)
AST: 19 U/L (ref 0–37)
Albumin: 4.5 g/dL (ref 3.5–5.2)
Alkaline Phosphatase: 64 U/L (ref 39–117)
BUN: 17 mg/dL (ref 6–23)
CO2: 28 mEq/L (ref 19–32)
Calcium: 9.4 mg/dL (ref 8.4–10.5)
Chloride: 105 mEq/L (ref 96–112)
Creatinine, Ser: 1.16 mg/dL (ref 0.40–1.50)
GFR: 79.26 mL/min (ref >60.00)
Glucose, Bld: 105 mg/dL — ABNORMAL HIGH (ref 70–99)
Potassium: 4.5 mEq/L (ref 3.5–5.1)
Sodium: 140 mEq/L (ref 135–145)
Total Bilirubin: 1 mg/dL (ref 0.2–1.2)
Total Protein: 6.9 g/dL (ref 6.0–8.3)

## 2019-11-01 LAB — CBC
HCT: 41 % (ref 39.0–52.0)
Hemoglobin: 12.7 g/dL — ABNORMAL LOW (ref 13.0–17.0)
MCHC: 31 g/dL (ref 30.0–36.0)
MCV: 75.5 fl — ABNORMAL LOW (ref 78.0–100.0)
Platelets: 221 10*3/uL (ref 150.0–400.0)
RBC: 5.43 Mil/uL (ref 4.22–5.81)
RDW: 14.5 % (ref 11.5–15.5)
WBC: 4.4 10*3/uL (ref 4.0–10.5)

## 2019-11-01 LAB — HEMOGLOBIN A1C: Hgb A1c MFr Bld: 6.2 % (ref 4.6–6.5)

## 2019-11-01 LAB — PSA: PSA: 4.87 ng/mL — ABNORMAL HIGH (ref 0.10–4.00)

## 2019-11-01 LAB — URIC ACID: Uric Acid, Serum: 7 mg/dL (ref 4.0–7.8)

## 2019-11-01 NOTE — Patient Instructions (Signed)

## 2019-11-01 NOTE — Progress Notes (Signed)
Established Patient Office Visit  Subjective:  Patient ID: William Jacobson, male    DOB: 09-14-1966  Age: 53 y.o. MRN: [id number]  CC:  Chief Complaint  Patient presents with  . Annual Exam    Pt has no concerns today besides paperwork to fill out     HPI William Jacobson presents for follow-up of his blood pressure.  It remains normal today.  Believe that the elevation seen in the dentist office was probably due to pain in anxiety associated with anticipated dental work.  Brings in some labs drawn at his worksite back in September.  Hemoglobin A1c was elevated to 6.8.  He has since been cutting back on the carbs in his diet.  History of elevated PSA.  He was seen by the urologist a year ago.  Core samples were taken from his prostate and were all negative he tells me.  His urine flow is good.  There is no pre or post void dribbling.  Nocturia x1.  Uric acid was high side normal at 6.8 and above-mentioned blood work.  He has no history of gout.  There is no family history of gout.  Past Medical History:  Diagnosis Date  . Contact dermatitis and other eczema, due to unspecified cause   . Pain in joint, lower leg    pain in right knee- torn minisicus lateral  . Personal history of other diseases of digestive system    history of hemorrhoids    Past Surgical History:  Procedure Laterality Date  . KNEE ARTHROSCOPY  2008   arthroscopy right knee torn laterlal  miniscus    Family History  Problem Relation Age of Onset  . Diabetes Mother   . Healthy Father   . Heart attack Maternal Grandmother   . Alcohol abuse Maternal Grandfather   . Cirrhosis Maternal Grandfather   . Colon cancer Neg Hx     Social History   Socioeconomic History  . Marital status: Married    Spouse name: Not on file  . Number of children: 3  . Years of education: 25  . Highest education level: Not on file  Occupational History  . Occupation: Maintenance Superviser   Tobacco Use  . Smoking status:  Never Smoker  . Smokeless tobacco: Never Used  Substance and Sexual Activity  . Alcohol use: Yes    Alcohol/week: 0.0 standard drinks    Comment: Occasionally  . Drug use: No  . Sexual activity: Yes    Partners: Female  Other Topics Concern  . 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 MARRIAGE- HITTING A LITTLE TOUGH SPOT RE: MONEY, PARENTING DIFFICULTIES WITH HIS OLDER SON FROM PREVIOUS MARRIAGE   Social Determinants of Health   Financial Resource Strain:   . Difficulty of Paying Living Expenses: Not on file  Food Insecurity:   . Worried About Charity fundraiser in the Last Year: Not on file  . Ran Out of Food in the Last Year: Not on file  Transportation Needs:   . Lack of Transportation (Medical): Not on file  . Lack of Transportation (Non-Medical): Not on file  Physical Activity:   . Days of Exercise per Week: Not on file  . Minutes of Exercise per Session: Not on file  Stress:   . Feeling of Stress : Not on file  Social Connections:   .  Frequency of Communication with Friends and Family: Not on file  . Frequency of Social Gatherings with Friends and Family: Not on file  . Attends Religious Services: Not on file  . Active Member of Clubs or Organizations: Not on file  . Attends Archivist Meetings: Not on file  . Marital Status: Not on file  Intimate Partner Violence:   . Fear of Current or Ex-Partner: Not on file  . Emotionally Abused: Not on file  . Physically Abused: Not on file  . Sexually Abused: Not on file    No outpatient medications prior to visit.   No facility-administered medications prior to visit.    No Known Allergies  ROS Review of Systems  Constitutional: Negative.   HENT: Negative.   Eyes: Negative for photophobia and visual disturbance.  Respiratory: Negative.   Cardiovascular: Negative.   Gastrointestinal: Negative.   Endocrine:  Negative for polyphagia and polyuria.  Genitourinary: Negative for difficulty urinating, frequency and urgency.  Musculoskeletal: Negative for gait problem and joint swelling.  Skin: Negative for pallor and rash.  Allergic/Immunologic: Negative for immunocompromised state.  Neurological: Negative for speech difficulty and light-headedness.  Hematological: Does not bruise/bleed easily.  Psychiatric/Behavioral: Negative.       Objective:    Physical Exam  Constitutional: He is oriented to person, place, and time. He appears well-developed and well-nourished. No distress.  HENT:  Head: Normocephalic and atraumatic.  Right Ear: External ear normal.  Left Ear: External ear normal.  Eyes: Conjunctivae are normal. Right eye exhibits no discharge. Left eye exhibits no discharge. No scleral icterus.  Neck: No JVD present. No tracheal deviation present.  Pulmonary/Chest: Effort normal. No stridor.  Neurological: He is alert and oriented to person, place, and time.  Skin: Skin is warm and dry. He is not diaphoretic.  Psychiatric: He has a normal mood and affect. His behavior is normal.    BP 132/80 (BP Location: Right Arm, Patient Position: Sitting, Cuff Size: Normal)   Pulse (!) 50   Temp 97.8 F (36.6 C) (Temporal)   Ht 5\' 11"  (1.803 m)   Wt 196 lb 12.8 oz (89.3 kg)   SpO2 99%   BMI 27.45 kg/m  Wt Readings from Last 3 Encounters:  11/01/19 196 lb 12.8 oz (89.3 kg)  08/26/19 198 lb (89.8 kg)  01/08/19 182 lb (82.6 kg)     Health Maintenance Due  Topic Date Due  . COLONOSCOPY  12/31/2018    There are no preventive care reminders to display for this patient.  Lab Results  Component Value Date   TSH 1.22 06/10/2010   Lab Results  Component Value Date   WBC 7.9 03/28/2018   HGB 13.2 03/28/2018   HCT 39.9 03/28/2018   MCV 92.5 03/28/2018   PLT 231.0 03/28/2018   Lab Results  Component Value Date   NA 141 01/08/2019   K 4.0 01/08/2019   CO2 29 01/08/2019   GLUCOSE  98 01/08/2019   BUN 18 01/08/2019   CREATININE 1.08 01/08/2019   BILITOT 0.5 03/28/2018   ALKPHOS 72 10/04/2018   AST 23 10/04/2018   ALT 21 10/04/2018   PROT 6.4 03/28/2018   ALBUMIN 4.0 03/28/2018   CALCIUM 9.0 01/08/2019   GFR 86.33 01/08/2019   Lab Results  Component Value Date   CHOL 152 10/04/2018   Lab Results  Component Value Date   HDL 53 10/04/2018   Lab Results  Component Value Date   LDLCALC 84 10/04/2018   Lab  Results  Component Value Date   TRIG 75 10/04/2018   Lab Results  Component Value Date   CHOLHDL 3 03/28/2018   Lab Results  Component Value Date   HGBA1C 6.3 01/08/2019      Assessment & Plan:   Problem List Items Addressed This Visit      Other   Healthcare maintenance   Relevant Orders   Ambulatory referral to Gastroenterology   Elevated PSA - Primary   Prediabetes   Relevant Orders   HgB A1c   Elevated uric acid in blood   Relevant Orders   Uric acid      No orders of the defined types were placed in this encounter.   Follow-up: Return in about 6 months (around 05/01/2020).   Drawing PSA today.  We will follow it up in 6 months and had level.  Discussed starting Glucophage and is hemoglobin A1c remains elevated.  Patient again declines flu vaccine today.  He does agree to go for follow-up colonoscopy. Libby Maw, MD

## 2019-12-03 ENCOUNTER — Encounter: Payer: Self-pay | Admitting: Family Medicine

## 2020-01-16 ENCOUNTER — Encounter: Payer: Self-pay | Admitting: Gastroenterology

## 2020-02-18 ENCOUNTER — Other Ambulatory Visit: Payer: Self-pay

## 2020-02-18 ENCOUNTER — Ambulatory Visit (AMBULATORY_SURGERY_CENTER): Payer: Self-pay

## 2020-02-18 VITALS — Temp 97.5°F | Ht 71.0 in | Wt 193.0 lb

## 2020-02-18 DIAGNOSIS — Z01818 Encounter for other preprocedural examination: Secondary | ICD-10-CM

## 2020-02-18 DIAGNOSIS — Z86010 Personal history of colonic polyps: Secondary | ICD-10-CM

## 2020-02-18 MED ORDER — NA SULFATE-K SULFATE-MG SULF 17.5-3.13-1.6 GM/177ML PO SOLN: 1.0000 | Freq: Once | ORAL | 0 refills | Status: AC

## 2020-02-18 NOTE — Progress Notes (Signed)
No egg or soy allergy known to patient  No issues with past sedation with any surgeries  or procedures, no intubation problems  No diet pills per patient No home 02 use per patient  No blood thinners per patient  Pt denies issues with constipation  No A fib or A flutter  EMMI video sent to pt's e mail   suprep code and coupon given  Due to the COVID-19 pandemic we are asking patients to follow these guidelines. Please only bring one care partner. Please be aware that your care partner may wait in the car in the parking lot or if they feel like they will be too hot to wait in the car, they may wait in the lobby on the 4th floor. All care partners are required to wear a mask the entire time (we do not have any that we can provide them), they need to practice social distancing, and we will do a Covid check for all patient's and care partners when you arrive. Also we will check their temperature and your temperature. If the care partner waits in their car they need to stay in the parking lot the entire time and we will call them on their cell phone when the patient is ready for discharge so they can bring the car to the front of the building. Also all patient's will need to wear a mask into building.

## 2020-02-19 ENCOUNTER — Telehealth: Payer: Self-pay | Admitting: Family Medicine

## 2020-02-19 NOTE — Telephone Encounter (Signed)
Patient is calling and wanted to speak to someone regarding getting a referral. CB is [phone].

## 2020-02-20 NOTE — Telephone Encounter (Signed)
Pt has a scheduled appointment next week w/ [facility].

## 2020-02-26 ENCOUNTER — Other Ambulatory Visit: Payer: Self-pay

## 2020-02-26 ENCOUNTER — Telehealth: Payer: Self-pay | Admitting: Nurse Practitioner

## 2020-02-26 ENCOUNTER — Other Ambulatory Visit (HOSPITAL_COMMUNITY)
Admission: RE | Admit: 2020-02-26 | Discharge: 2020-02-26 | Disposition: A | Discharge status: Home / Self Care | Payer: BC Managed Care – PPO | Source: Ambulatory Visit | Attending: Nurse Practitioner | Admitting: Nurse Practitioner

## 2020-02-26 ENCOUNTER — Ambulatory Visit: Payer: BC Managed Care – PPO | Admitting: Nurse Practitioner

## 2020-02-26 ENCOUNTER — Encounter: Payer: Self-pay | Admitting: Nurse Practitioner

## 2020-02-26 VITALS — BP 126/80 | HR 76 | Temp 97.3°F | Ht 71.0 in | Wt 183.2 lb

## 2020-02-26 DIAGNOSIS — R102 Pelvic and perineal pain: Secondary | ICD-10-CM | POA: Diagnosis not present

## 2020-02-26 DIAGNOSIS — R972 Elevated prostate specific antigen [PSA]: Secondary | ICD-10-CM

## 2020-02-26 LAB — PSA: PSA: 5.24 ng/mL — ABNORMAL HIGH (ref 0.10–4.00)

## 2020-02-26 MED ORDER — SULFAMETHOXAZOLE-TRIMETHOPRIM 800-160 MG PO TABS: 1.0000 | ORAL_TABLET | Freq: Two times a day (BID) | ORAL | 0 refills | Status: DC

## 2020-02-26 NOTE — Telephone Encounter (Signed)
Please instruct Mr. William Jacobson to also start oral abx as prescribed. This is to treat empirically for possible acute prostatitis. rx sent

## 2020-02-26 NOTE — Patient Instructions (Signed)
Go to lab for blood draw and urine collection 

## 2020-02-26 NOTE — Progress Notes (Signed)
Subjective:  Patient ID: William Jacobson, male    DOB: [birth year]  Age: 54 y.o. MRN: [id number]  CC: Groin Pain (pt reported pain started 2 weeks ago between anus and testicles//pt states its got better but he can still feel it esp when sitting//no problems urinating)  Groin Pain The patient's pertinent negatives include no genital injury, genital itching, genital lesions, pelvic pain, penile discharge, penile pain, priapism, scrotal swelling or testicular pain. Primary symptoms comment: pressure and fullness in perineal region. This is a new problem. The current episode started 1 to 4 weeks ago. The problem occurs constantly. The problem has been gradually improving. The pain is mild. Pertinent negatives include no abdominal pain, anorexia, chills, constipation, coughing, discolored urine, frequency, headaches, hematuria, hesitancy, joint pain, joint swelling, nausea, painful intercourse, rash, urgency or urinary retention. Nothing aggravates the symptoms. He has tried nothing for the symptoms. He is sexually active. He inconsistently uses condoms. No, his partner does not have an STD. His past medical history is significant for BPH. There is no history of erectile dysfunction, a femoral hernia, gonorrhea, HIV, an inguinal hernia, prostatitis or varicocele.  had prostate biopsy done by [facility] due to elevated PSA.  Reviewed past Medical, Social and Family history today.  No outpatient medications prior to visit.   No facility-administered medications prior to visit.    ROS See HPI  Objective:  BP 126/80   Pulse 76   Temp (!) 97.3 F (36.3 C) (Tympanic)   Ht 5\' 11"  (1.803 m)   Wt 183 lb 3.2 oz (83.1 kg)   SpO2 97%   BMI 25.55 kg/m   BP Readings from Last 3 Encounters:  02/26/20 126/80  11/01/19 132/80  08/26/19 136/80    Wt Readings from Last 3 Encounters:  02/26/20 183 lb 3.2 oz (83.1 kg)  02/18/20 193 lb (87.5 kg)  11/01/19 196 lb 12.8 oz (89.3 kg)    Physical  Exam Exam conducted with a chaperone present.  Cardiovascular:     Pulses: Normal pulses.  Pulmonary:     Effort: Pulmonary effort is normal.  Abdominal:     General: Bowel sounds are normal.     Palpations: Abdomen is soft.     Hernia: There is no hernia in the left inguinal area or right inguinal area.  Genitourinary:    Penis: Normal and circumcised.      Testes: Normal.     Epididymis:     Right: Normal.     Left: Normal.     Prostate: Enlarged. Not tender.     Rectum: Normal. No tenderness or external hemorrhoid.  Lymphadenopathy:     Lower Body: No right inguinal adenopathy. No left inguinal adenopathy.  Skin:    General: Skin is warm and dry.     Findings: No erythema or rash.     Lab Results  Component Value Date   WBC 4.4 11/01/2019   HGB 12.7 (L) 11/01/2019   HCT 41.0 11/01/2019   PLT 221.0 11/01/2019   GLUCOSE 105 (H) 11/01/2019   CHOL 186 11/01/2019   TRIG 63.0 11/01/2019   HDL 45.90 11/01/2019   LDLCALC 128 (H) 11/01/2019   ALT 25 11/01/2019   AST 19 11/01/2019   NA 140 11/01/2019   K 4.5 11/01/2019   CL 105 11/01/2019   CREATININE 1.16 11/01/2019   BUN 17 11/01/2019   CO2 28 11/01/2019   TSH 1.22 06/10/2010   PSA 5.24 (H) 02/26/2020   HGBA1C 6.2 11/01/2019  Assessment & Plan:  This visit occurred during the SARS-CoV-2 public health emergency.  Safety protocols were in place, including screening questions prior to the visit, additional usage of staff PPE, and extensive cleaning of exam room while observing appropriate contact time as indicated for disinfecting solutions.   William Jacobson was seen today for groin pain.  Diagnoses and all orders for this visit:  Perineum pain, male -     Urinalysis w microscopic + reflex cultur -     Urine cytology ancillary only([place]) -     sulfamethoxazole-trimethoprim (BACTRIM DS) 800-160 MG tablet; Take 1 tablet by mouth 2 (two) times daily. With food  Elevated PSA -     PSA   I am having William F.  Jacobson start on sulfamethoxazole-trimethoprim.  Meds ordered this encounter  Medications  . sulfamethoxazole-trimethoprim (BACTRIM DS) 800-160 MG tablet    Sig: Take 1 tablet by mouth 2 (two) times daily. With food    Dispense:  20 tablet    Refill:  0    Order Specific Question:   Supervising Provider    Answer:   Ronnald Nian [id number]    Problem List Items Addressed This Visit      Other   Elevated PSA   Relevant Orders   PSA (Completed)    Other Visit Diagnoses    Perineum pain, male    -  Primary   Relevant Medications   sulfamethoxazole-trimethoprim (BACTRIM DS) 800-160 MG tablet   Other Relevant Orders   Urinalysis w microscopic + reflex cultur   Urine cytology ancillary only([place])       Follow-up: No follow-ups on file.  Wilfred Lacy, NP

## 2020-02-27 ENCOUNTER — Other Ambulatory Visit: Payer: Self-pay | Admitting: Gastroenterology

## 2020-02-27 ENCOUNTER — Ambulatory Visit (INDEPENDENT_AMBULATORY_CARE_PROVIDER_SITE_OTHER): Payer: BC Managed Care – PPO

## 2020-02-27 DIAGNOSIS — Z1159 Encounter for screening for other viral diseases: Secondary | ICD-10-CM | POA: Diagnosis not present

## 2020-02-27 LAB — URINALYSIS W MICROSCOPIC + REFLEX CULTURE
Bacteria, UA: NONE SEEN /HPF
Bilirubin Urine: NEGATIVE
Glucose, UA: NEGATIVE
Hgb urine dipstick: NEGATIVE
Hyaline Cast: NONE SEEN /LPF
Ketones, ur: NEGATIVE
Leukocyte Esterase: NEGATIVE
Nitrites, Initial: NEGATIVE
RBC / HPF: NONE SEEN /HPF (ref 0–2)
Specific Gravity, Urine: 1.025 (ref 1.001–1.03)
Squamous Epithelial / HPF: NONE SEEN /HPF (ref <=5)
WBC, UA: NONE SEEN /HPF (ref 0–5)
pH: 5.5 (ref 5.0–8.0)

## 2020-02-27 LAB — SARS CORONAVIRUS 2 (TAT 6-24 HRS): SARS Coronavirus 2: NEGATIVE

## 2020-02-27 LAB — URINE CYTOLOGY ANCILLARY ONLY
Chlamydia: NEGATIVE
Comment: NEGATIVE
Comment: NEGATIVE
Comment: NORMAL
Neisseria Gonorrhea: NEGATIVE
Trichomonas: NEGATIVE

## 2020-02-27 LAB — NO CULTURE INDICATED

## 2020-02-27 NOTE — Telephone Encounter (Signed)
Pt called back during lunch, please return call when availble

## 2020-02-27 NOTE — Telephone Encounter (Signed)
Left a message for pt to call back

## 2020-02-27 NOTE — Telephone Encounter (Signed)
Pt is aware of message below.

## 2020-03-03 ENCOUNTER — Encounter: Payer: Self-pay | Admitting: Gastroenterology

## 2020-03-03 ENCOUNTER — Ambulatory Visit (AMBULATORY_SURGERY_CENTER): Payer: BC Managed Care – PPO | Admitting: Gastroenterology

## 2020-03-03 ENCOUNTER — Other Ambulatory Visit: Payer: Self-pay

## 2020-03-03 VITALS — BP 151/89 | HR 60 | Temp 96.6°F | Resp 11 | Ht 71.0 in | Wt 193.0 lb

## 2020-03-03 DIAGNOSIS — Z86010 Personal history of colonic polyps: Secondary | ICD-10-CM

## 2020-03-03 DIAGNOSIS — Z1211 Encounter for screening for malignant neoplasm of colon: Secondary | ICD-10-CM | POA: Diagnosis not present

## 2020-03-03 DIAGNOSIS — D123 Benign neoplasm of transverse colon: Secondary | ICD-10-CM | POA: Diagnosis not present

## 2020-03-03 DIAGNOSIS — D124 Benign neoplasm of descending colon: Secondary | ICD-10-CM

## 2020-03-03 MED ORDER — SODIUM CHLORIDE 0.9 % IV SOLN: 500.0000 mL | Freq: Once | INTRAVENOUS | Status: DC

## 2020-03-03 NOTE — Progress Notes (Signed)
Called to room to assist during endoscopic procedure.  Patient ID and intended procedure confirmed with present staff. Received instructions for my participation in the procedure from the performing physician.  

## 2020-03-03 NOTE — Patient Instructions (Signed)
YOU HAD AN ENDOSCOPIC PROCEDURE TODAY AT THE [facility]:   Refer to the procedure report that was given to you for any specific questions about what was found during the examination.  If the procedure report does not answer your questions, please call your gastroenterologist to clarify.  If you requested that your care partner not be given the details of your procedure findings, then the procedure report has been included in a sealed envelope for you to review at your convenience later.  YOU SHOULD EXPECT: Some feelings of bloating in the abdomen. Passage of more gas than usual.  Walking can help get rid of the air that was put into your GI tract during the procedure and reduce the bloating. If you had a lower endoscopy (such as a colonoscopy or flexible sigmoidoscopy) you may notice spotting of blood in your stool or on the toilet paper. If you underwent a bowel prep for your procedure, you may not have a normal bowel movement for a few days.  Please Note:  You might notice some irritation and congestion in your nose or some drainage.  This is from the oxygen used during your procedure.  There is no need for concern and it should clear up in a day or so.  SYMPTOMS TO REPORT IMMEDIATELY:   Following lower endoscopy (colonoscopy or flexible sigmoidoscopy):  Excessive amounts of blood in the stool  Significant tenderness or worsening of abdominal pains  Swelling of the abdomen that is new, acute  Fever of 100F or higher   Following upper endoscopy (EGD)  Vomiting of blood or coffee ground material  New chest pain or pain under the shoulder blades  Painful or persistently difficult swallowing  New shortness of breath  Fever of 100F or higher  Black, tarry-looking stools  For urgent or emergent issues, a gastroenterologist can be reached at any hour by calling [phone]. Do not use MyChart messaging for urgent concerns.    DIET:  We do recommend a small meal at first, but  then you may proceed to your regular diet.  Drink plenty of fluids but you should avoid alcoholic beverages for 24 hours.  ACTIVITY:  You should plan to take it easy for the rest of today and you should NOT DRIVE or use heavy machinery until tomorrow (because of the sedation medicines used during the test).    FOLLOW UP: Our staff will call the number listed on your records 48-72 hours following your procedure to check on you and address any questions or concerns that you may have regarding the information given to you following your procedure. If we do not reach you, we will leave a message.  We will attempt to reach you two times.  During this call, we will ask if you have developed any symptoms of COVID 19. If you develop any symptoms (ie: fever, flu-like symptoms, shortness of breath, cough etc.) before then, please call [phone].  If you test positive for Covid 19 in the 2 weeks post procedure, please call and report this information to us.    If any biopsies were taken you will be contacted by phone or by letter within the next 1-3 weeks.  Please call us at [phone] if you have not heard about the biopsies in 3 weeks.    SIGNATURES/CONFIDENTIALITY: You and/or your care partner have signed paperwork which will be entered into your electronic medical record.  These signatures attest to the fact that that the information above on   your After Visit Summary has been reviewed and is understood.  Full responsibility of the confidentiality of this discharge information lies with you and/or your care-partner. 

## 2020-03-03 NOTE — Op Note (Signed)
[facility] Patient Name: William Jacobson Procedure Date: 03/03/2020 7:57 AM MRN: [id number] Endoscopist: Remo Lipps P. Havery Moros , MD Age: 54 Referring MD:  Date of Birth: 18-Oct-1966 Gender: Male Account #: [id number] Procedure:                Colonoscopy Indications:              Surveillance: Personal history of adenomatous                            polyps on last colonoscopy 3 years ago (multiple                            adenomas - one advanced adenoma) Medicines:                Monitored Anesthesia Care Procedure:                Pre-Anesthesia Assessment:                           - Prior to the procedure, a History and Physical                            was performed, and patient medications and                            allergies were reviewed. The patient's tolerance of                            previous anesthesia was also reviewed. The risks                            and benefits of the procedure and the sedation                            options and risks were discussed with the patient.                            All questions were answered, and informed consent                            was obtained. Prior Anticoagulants: The patient has                            taken no previous anticoagulant or antiplatelet                            agents. ASA Grade Assessment: II - A patient with                            mild systemic disease. After reviewing the risks                            and benefits, the patient was deemed in  satisfactory condition to undergo the procedure.                           After obtaining informed consent, the colonoscope                            was passed under direct vision. Throughout the                            procedure, the patient's blood pressure, pulse, and                            oxygen saturations were monitored continuously. The                            Colonoscope was introduced  through the anus and                            advanced to the the cecum, identified by                            appendiceal orifice and ileocecal valve. The                            colonoscopy was performed without difficulty. The                            patient tolerated the procedure well. The quality                            of the bowel preparation was good. The ileocecal                            valve, appendiceal orifice, and rectum were                            photographed. Scope In: 8:02:45 AM Scope Out: 8:17:13 AM Scope Withdrawal Time: 0 hours 11 minutes 47 seconds  Total Procedure Duration: 0 hours 14 minutes 28 seconds  Findings:                 The perianal and digital rectal examinations were                            normal.                           A diminutive polyp was found in the hepatic                            flexure. The polyp was sessile. The polyp was                            removed with a cold snare. Resection and retrieval  were complete.                           A diminutive polyp was found in the descending                            colon. The polyp was sessile. The polyp was removed                            with a cold snare. Resection and retrieval were                            complete.                           Internal hemorrhoids were found during retroflexion.                           The exam was otherwise without abnormality. Complications:            No immediate complications. Estimated blood loss:                            Minimal. Estimated Blood Loss:     Estimated blood loss was minimal. Impression:               - One diminutive polyp at the hepatic flexure,                            removed with a cold snare. Resected and retrieved.                           - One diminutive polyp in the descending colon,                            removed with a cold snare. Resected and  retrieved.                           - Internal hemorrhoids.                           - The examination was otherwise normal. Recommendation:           - Patient has a contact number available for                            emergencies. The signs and symptoms of potential                            delayed complications were discussed with the                            patient. Return to normal activities tomorrow.                            Written discharge instructions were provided to the  patient.                           - Resume previous diet.                           - Continue present medications.                           - Await pathology results. Remo Lipps P. Havery Moros, MD 03/03/2020 8:20:28 AM This report has been signed electronically.

## 2020-03-03 NOTE — Progress Notes (Signed)
pt tolerated well. VSS. awake and to recovery. Report given to RN.  

## 2020-03-03 NOTE — Progress Notes (Signed)
Vitals-DT Temp-JB  Pt's states no medical or surgical changes since previsit or office visit. 

## 2020-03-05 ENCOUNTER — Telehealth: Payer: Self-pay

## 2020-03-05 NOTE — Telephone Encounter (Signed)
  Follow up Call-  Call back number 03/03/2020  Post procedure Call Back phone  # [phone]  Permission to leave phone message Yes  Some recent data might be hidden     Patient questions:  Do you have a fever, pain , or abdominal swelling? No. Pain Score  0 *  Have you tolerated food without any problems? Yes.    Have you been able to return to your normal activities? Yes.    Do you have any questions about your discharge instructions: Diet   No. Medications  No. Follow up visit  No.  Do you have questions or concerns about your Care? No.  Actions: * If pain score is 4 or above: No action needed, pain <4.  1. Have you developed a fever since your procedure? no  2.   Have you had an respiratory symptoms (SOB or cough) since your procedure? no  3.   Have you tested positive for COVID 19 since your procedure no  4.   Have you had any family members/close contacts diagnosed with the COVID 19 since your procedure?  no   If yes to any of these questions please route to Joylene John, RN and Erenest Rasher, RN

## 2020-03-09 ENCOUNTER — Encounter: Payer: Self-pay | Admitting: Gastroenterology

## 2020-03-20 DIAGNOSIS — R972 Elevated prostate specific antigen [PSA]: Secondary | ICD-10-CM | POA: Diagnosis not present

## 2020-03-20 DIAGNOSIS — N400 Enlarged prostate without lower urinary tract symptoms: Secondary | ICD-10-CM | POA: Diagnosis not present

## 2020-06-29 ENCOUNTER — Ambulatory Visit (INDEPENDENT_AMBULATORY_CARE_PROVIDER_SITE_OTHER): Payer: 59 | Admitting: Family Medicine

## 2020-06-29 ENCOUNTER — Other Ambulatory Visit: Payer: Self-pay

## 2020-06-29 ENCOUNTER — Other Ambulatory Visit (HOSPITAL_COMMUNITY)
Admission: RE | Admit: 2020-06-29 | Discharge: 2020-06-29 | Disposition: A | Discharge status: Home / Self Care | Payer: 59 | Source: Ambulatory Visit | Attending: Family Medicine | Admitting: Family Medicine

## 2020-06-29 ENCOUNTER — Encounter: Payer: Self-pay | Admitting: Family Medicine

## 2020-06-29 VITALS — BP 136/74 | HR 76 | Temp 98.5°F | Ht 71.0 in | Wt 184.0 lb

## 2020-06-29 DIAGNOSIS — R102 Pelvic and perineal pain unspecified side: Secondary | ICD-10-CM

## 2020-06-29 DIAGNOSIS — R7303 Prediabetes: Secondary | ICD-10-CM | POA: Diagnosis not present

## 2020-06-29 DIAGNOSIS — D509 Iron deficiency anemia, unspecified: Secondary | ICD-10-CM

## 2020-06-29 DIAGNOSIS — N400 Enlarged prostate without lower urinary tract symptoms: Secondary | ICD-10-CM | POA: Diagnosis not present

## 2020-06-29 DIAGNOSIS — K4090 Unilateral inguinal hernia, without obstruction or gangrene, not specified as recurrent: Secondary | ICD-10-CM

## 2020-06-29 DIAGNOSIS — R972 Elevated prostate specific antigen [PSA]: Secondary | ICD-10-CM

## 2020-06-29 NOTE — Patient Instructions (Signed)
Benign Prostatic Hyperplasia  Benign prostatic hyperplasia (BPH) is an enlarged prostate gland that is caused by the normal aging process and not by cancer. The prostate is a walnut-sized gland that is involved in the production of semen. It is located in front of the rectum and below the bladder. The bladder stores urine and the urethra is the tube that carries the urine out of the body. The prostate may get bigger as a man gets older. An enlarged prostate can press on the urethra. This can make it harder to pass urine. The build-up of urine in the bladder can cause infection. Back pressure and infection may progress to bladder damage and kidney (renal) failure. What are the causes? This condition is part of a normal aging process. However, not all men develop problems from this condition. If the prostate enlarges away from the urethra, urine flow will not be blocked. If it enlarges toward the urethra and compresses it, there will be problems passing urine. What increases the risk? This condition is more likely to develop in men over the age of 50 years. What are the signs or symptoms? Symptoms of this condition include:  Getting up often during the night to urinate.  Needing to urinate frequently during the day.  Difficulty starting urine flow.  Decrease in size and strength of your urine stream.  Leaking (dribbling) after urinating.  Inability to pass urine. This needs immediate treatment.  Inability to completely empty your bladder.  Pain when you pass urine. This is more common if there is also an infection.  Urinary tract infection (UTI). How is this diagnosed? This condition is diagnosed based on your medical history, a physical exam, and your symptoms. Tests will also be done, such as:  A post-void bladder scan. This measures any amount of urine that may remain in your bladder after you finish urinating.  A digital rectal exam. In a rectal exam, your health care provider  checks your prostate by putting a lubricated, gloved finger into your rectum to feel the back of your prostate gland. This exam detects the size of your gland and any abnormal lumps or growths.  An exam of your urine (urinalysis).  A prostate specific antigen (PSA) screening. This is a blood test used to screen for prostate cancer.  An ultrasound. This test uses sound waves to electronically produce a picture of your prostate gland. Your health care provider may refer you to a specialist in kidney and prostate diseases (urologist). How is this treated? Once symptoms begin, your health care provider will monitor your condition (active surveillance or watchful waiting). Treatment for this condition will depend on the severity of your condition. Treatment may include:  Observation and yearly exams. This may be the only treatment needed if your condition and symptoms are mild.  Medicines to relieve your symptoms, including: ? Medicines to shrink the prostate. ? Medicines to relax the muscle of the prostate.  Surgery in severe cases. Surgery may include: ? Prostatectomy. In this procedure, the prostate tissue is removed completely through an open incision or with a laparoscope or robotics. ? Transurethral resection of the prostate (TURP). In this procedure, a tool is inserted through the opening at the tip of the penis (urethra). It is used to cut away tissue of the inner core of the prostate. The pieces are removed through the same opening of the penis. This removes the blockage. ? Transurethral incision (TUIP). In this procedure, small cuts are made in the prostate. This lessens   the prostate's pressure on the urethra. ? Transurethral microwave thermotherapy (TUMT). This procedure uses microwaves to create heat. The heat destroys and removes a small amount of prostate tissue. ? Transurethral needle ablation (TUNA). This procedure uses radio frequencies to destroy and remove a small amount of  prostate tissue. ? Interstitial laser coagulation ([place]). This procedure uses a laser to destroy and remove a small amount of prostate tissue. ? Transurethral electrovaporization (TUVP). This procedure uses electrodes to destroy and remove a small amount of prostate tissue. ? Prostatic urethral lift. This procedure inserts an implant to push the lobes of the prostate away from the urethra. Follow these instructions at home:  Take over-the-counter and prescription medicines only as told by your health care provider.  Monitor your symptoms for any changes. Contact your health care provider with any changes.  Avoid drinking large amounts of liquid before going to bed or out in public.  Avoid or reduce how much caffeine or alcohol you drink.  Give yourself time when you urinate.  Keep all follow-up visits as told by your health care provider. This is important. Contact a health care provider if:  You have unexplained back pain.  Your symptoms do not get better with treatment.  You develop side effects from the medicine you are taking.  Your urine becomes very dark or has a bad smell.  Your lower abdomen becomes distended and you have trouble passing your urine. Get help right away if:  You have a fever or chills.  You suddenly cannot urinate.  You feel lightheaded, or very dizzy, or you faint.  There are large amounts of blood or clots in the urine.  Your urinary problems become hard to manage.  You develop moderate to severe low back or flank pain. The flank is the side of your body between the ribs and the hip. These symptoms may represent a serious problem that is an emergency. Do not wait to see if the symptoms will go away. Get medical help right away. Call your local emergency services (911 in the [place] Do not drive yourself to the hospital. Summary  Benign prostatic hyperplasia (BPH) is an enlarged prostate that is caused by the normal aging process and not by  cancer.  An enlarged prostate can press on the urethra. This can make it hard to pass urine.  This condition is part of a normal aging process and is more likely to develop in men over the age of 41 years.  Get help right away if you suddenly cannot urinate. This information is not intended to replace advice given to you by your health care provider. Make sure you discuss any questions you have with your health care provider. Document Revised: 10/02/2018 Document Reviewed: 12/12/2016 Elsevier Patient Education  2020 [employer].  Inguinal Hernia, Adult An inguinal hernia develops when fat or the intestines push through a weak spot in a muscle where your leg meets your lower abdomen (groin). This creates a bulge. This kind of hernia could also be:  In your scrotum, if you are male.  In folds of skin around your vagina, if you are male. There are three types of inguinal hernias:  Hernias that can be pushed back into the abdomen (are reducible). This type rarely causes pain.  Hernias that are not reducible (are incarcerated).  Hernias that are not reducible and lose their blood supply (are strangulated). This type of hernia requires emergency surgery. What are the causes? This condition is caused by having a  weak spot in the muscles or tissues in the groin. This weak spot develops over time. The hernia may poke through the weak spot when you suddenly strain your lower abdominal muscles, such as when you:  Lift a heavy object.  Strain to have a bowel movement. Constipation can lead to straining.  Cough. What increases the risk? This condition is more likely to develop in:  Men.  Pregnant women.  People who: ? Are overweight. ? Work in jobs that require long periods of standing or heavy lifting. ? Have had an inguinal hernia before. ? Smoke or have lung disease. These factors can lead to long-lasting (chronic) coughing. What are the signs or symptoms? Symptoms may depend  on the size of the hernia. Often, a small inguinal hernia has no symptoms. Symptoms of a larger hernia may include:  A lump in the groin area. This is easier to see when standing. It might not be visible when lying down.  Pain or burning in the groin. This may get worse when lifting, straining, or coughing.  A dull ache or a feeling of pressure in the groin.  In men, an unusual lump in the scrotum. Symptoms of a strangulated inguinal hernia may include:  A bulge in your groin that is very painful and tender to the touch.  A bulge that turns red or purple.  Fever, nausea, and vomiting.  Inability to have a bowel movement or to pass gas. How is this diagnosed? This condition is diagnosed based on your symptoms, your medical history, and a physical exam. Your health care provider may feel your groin area and ask you to cough. How is this treated? Treatment depends on the size of your hernia and whether you have symptoms. If you do not have symptoms, your health care provider may have you watch your hernia carefully and have you come in for follow-up visits. If your hernia is large or if you have symptoms, you may need surgery to repair the hernia. Follow these instructions at home: Lifestyle  Avoid lifting heavy objects.  Avoid standing for long periods of time.  Do not use any products that contain nicotine or tobacco, such as cigarettes and e-cigarettes. If you need help quitting, ask your health care provider.  Maintain a healthy weight. Preventing constipation  Take actions to prevent constipation. Constipation leads to straining with bowel movements, which can make a hernia worse or cause a hernia repair to break down. Your health care provider may recommend that you: ? Drink enough fluid to keep your urine pale yellow. ? Eat foods that are high in fiber, such as fresh fruits and vegetables, whole grains, and beans. ? Limit foods that are high in fat and processed sugars, such  as fried or sweet foods. ? Take an over-the-counter or prescription medicine for constipation. General instructions  You may try to push the hernia back in place by very gently pressing on it while lying down. Do not try to force the bulge back in if it will not push in easily.  Watch your hernia for any changes in shape, size, or color. Get help right away if you notice any changes.  Take over-the-counter and prescription medicines only as told by your health care provider.  Keep all follow-up visits as told by your health care provider. This is important. Contact a health care provider if:  You have a fever.  You develop new symptoms.  Your symptoms get worse. Get help right away if:  You  have pain in your groin that suddenly gets worse.  You have a bulge in your groin that: ? Suddenly gets bigger and does not get smaller. ? Becomes red or purple or painful to the touch.  You are a man and you have a sudden pain in your scrotum, or the size of your scrotum suddenly changes.  You cannot push the hernia back in place by very gently pressing on it when you are lying down. Do not try to force the bulge back in if it will not push in easily.  You have nausea or vomiting that does not go away.  You have a fast heartbeat.  You cannot have a bowel movement or pass gas. These symptoms may represent a serious problem that is an emergency. Do not wait to see if the symptoms will go away. Get medical help right away. Call your local emergency services (911 in the [place] Summary  An inguinal hernia develops when fat or the intestines push through a weak spot in a muscle where your leg meets your lower abdomen (groin).  This condition is caused by having a weak spot in muscles or tissue in your groin.  Symptoms may depend on the size of the hernia, and they may include pain or swelling in your groin. A small inguinal hernia often has no symptoms.  Treatment may not be needed if you do  not have symptoms. If you have symptoms or a large hernia, you may need surgery to repair the hernia.  Avoid lifting heavy objects. Also avoid standing for long amounts of time. This information is not intended to replace advice given to you by your health care provider. Make sure you discuss any questions you have with your health care provider. Document Revised: 12/09/2017 Document Reviewed: 08/09/2017 Elsevier Patient Education  [employer].

## 2020-06-29 NOTE — Progress Notes (Addendum)
Established Patient Office Visit  Subjective:  Patient ID: William Jacobson, male    DOB: 12/10/1965  Age: 54 y.o. MRN: [id number]  CC:  Chief Complaint  Patient presents with  . Acute Visit    patient states that he feels discomfort between testicle and anus.      HPI CARZELL SALDIVAR presents for follow-up of intermittent perineal discomfort.  Denies decrease in the force of his stream dysuria urinary hesitancy discharge fever chills nausea or vomiting change in bowel habits.  Recent colonoscopy with 2 small polyps.  Nocturia once or twice a week.  Has been with the same partner.  Past Medical History:  Diagnosis Date  . Contact dermatitis and other eczema, due to unspecified cause   . Pain in joint, lower leg    pain in right knee- torn minisicus lateral  . Personal history of other diseases of digestive system    history of hemorrhoids    Past Surgical History:  Procedure Laterality Date  . COLONOSCOPY  01/01/2016   4 polyps  . KNEE ARTHROSCOPY  2008   arthroscopy right knee torn laterlal  miniscus  . POLYPECTOMY    . PROSTATE BIOPSY  2019    Family History  Problem Relation Age of Onset  . Diabetes Mother   . Healthy Father   . Heart attack Maternal Grandmother   . Alcohol abuse Maternal Grandfather   . Cirrhosis Maternal Grandfather   . Colon cancer Neg Hx   . Colon polyps Neg Hx   . Esophageal cancer Neg Hx   . Rectal cancer Neg Hx   . Stomach cancer Neg Hx     Social History   Socioeconomic History  . Marital status: Married    Spouse name: Not on file  . Number of children: 3  . Years of education: 5  . Highest education level: Not on file  Occupational History  . Occupation: Maintenance Superviser   Tobacco Use  . Smoking status: Never Smoker  . Smokeless tobacco: Never Used  Vaping Use  . Vaping Use: Never used  Substance and Sexual Activity  . Alcohol use: Yes    Alcohol/week: 0.0 standard drinks    Comment: Occasionally, special  occasion  . Drug use: No  . Sexual activity: Yes    Partners: Female  Other Topics Concern  . Not on file  Social History Narrative   [employer], [employer] [place]. 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 MARRIAGE- HITTING A LITTLE TOUGH SPOT RE: [employer]ONE[employer], PARENTING DIFFICULTIES WITH HIS OLDER SON FROM PREVIOUS MARRIAGE   Social Determinants of Health   Financial Resource Strain:   . Difficulty of Paying Living Expenses:   Food Insecurity:   . Worried About Charity fundraiser in the Last Year:   . Arboriculturist in the Last Year:   Transportation Needs:   . Film/video editor (Medical):   Marland Kitchen Lack of Transportation (Non-Medical):   Physical Activity:   . Days of Exercise per Week:   . Minutes of Exercise per Session:   Stress:   . Feeling of Stress :   Social Connections:   . Frequency of Communication with Friends and Family:   . Frequency of Social Gatherings with Friends and Family:   . Attends Religious Services:   . Active Member of Clubs or Organizations:   . Attends Archivist Meetings:   Marland Kitchen Marital Status:  Intimate Partner Violence:   . Fear of Current or Ex-Partner:   . Emotionally Abused:   Marland Kitchen Physically Abused:   . Sexually Abused:     Outpatient Medications Prior to Visit  Medication Sig Dispense Refill  . sulfamethoxazole-trimethoprim (BACTRIM DS) 800-160 MG tablet Take 1 tablet by mouth 2 (two) times daily. With food (Patient not taking: Reported on 03/03/2020) 20 tablet 0   No facility-administered medications prior to visit.    No Known Allergies  ROS Review of Systems  Constitutional: Negative.   HENT: Negative.   Eyes: Negative for photophobia and visual disturbance.  Respiratory: Negative.   Cardiovascular: Negative.   Gastrointestinal: Negative.  Negative for abdominal pain, anal bleeding, blood in stool, diarrhea and vomiting.  Endocrine: Negative for polyphagia and polyuria.  Genitourinary: Negative  for decreased urine volume, difficulty urinating, discharge, dysuria, frequency, penile swelling, testicular pain and urgency.  Musculoskeletal: Negative for joint swelling and myalgias.  Skin: Negative for pallor and rash.  Allergic/Immunologic: Negative for immunocompromised state.  Neurological: Negative for weakness.  Hematological: Does not bruise/bleed easily.  Psychiatric/Behavioral: Negative.       Objective:    Physical Exam Vitals and nursing note reviewed.  Constitutional:      Appearance: Normal appearance. He is normal weight.  HENT:     Head: Normocephalic and atraumatic.     Right Ear: External ear normal.     Left Ear: External ear normal.     Mouth/Throat:     Pharynx: No oropharyngeal exudate.  Eyes:     General: No scleral icterus.       Right eye: No discharge.        Left eye: No discharge.     Conjunctiva/sclera: Conjunctivae normal.  Pulmonary:     Effort: Pulmonary effort is normal.  Abdominal:     General: Bowel sounds are normal. There is no distension.     Hernia: A hernia is present. Hernia is present in the left inguinal area. There is no hernia in the right inguinal area.  Genitourinary:    Pubic Area: No rash.      Penis: No hypospadias, erythema, tenderness, discharge, swelling or lesions.      Testes:        Right: Mass, tenderness or swelling not present. Right testis is descended.        Left: Tenderness or swelling not present. Left testis is descended.     Epididymis:     Right: Not inflamed or enlarged.     Left: Not inflamed or enlarged.     Prostate: Enlarged. Not tender and no nodules present.     Rectum: Guaiac result negative. No mass, tenderness, anal fissure, external hemorrhoid or internal hemorrhoid. Normal anal tone.  Lymphadenopathy:     Lower Body: No right inguinal adenopathy. No left inguinal adenopathy.  Skin:    General: Skin is warm and dry.  Neurological:     Mental Status: He is alert and oriented to person,  place, and time.  Psychiatric:        Mood and Affect: Mood normal.        Behavior: Behavior normal.     BP 136/74   Pulse 76   Temp 98.5 F (36.9 C) (Tympanic)   Ht 5\' 11"  (1.803 m)   Wt 184 lb (83.5 kg)   SpO2 98%   BMI 25.66 kg/m  Wt Readings from Last 3 Encounters:  06/29/20 184 lb (83.5 kg)  03/03/20 193 lb (87.5 kg)  02/26/20 183 lb 3.2 oz (83.1 kg)     Health Maintenance Due  Topic Date Due  . Hepatitis C Screening  Never done  . INFLUENZA VACCINE  06/21/2020    There are no preventive care reminders to display for this patient.  Lab Results  Component Value Date   TSH 1.22 06/10/2010   Lab Results  Component Value Date   WBC 3.4 (L) 06/29/2020   HGB 12.6 (L) 06/29/2020   HCT 40.2 06/29/2020   MCV 76.5 (L) 06/29/2020   PLT 220.0 06/29/2020   Lab Results  Component Value Date   NA 140 11/01/2019   K 4.5 11/01/2019   CO2 28 11/01/2019   GLUCOSE 105 (H) 11/01/2019   BUN 17 11/01/2019   CREATININE 1.16 11/01/2019   BILITOT 1.0 11/01/2019   ALKPHOS 64 11/01/2019   AST 19 11/01/2019   ALT 25 11/01/2019   PROT 6.9 11/01/2019   ALBUMIN 4.5 11/01/2019   CALCIUM 9.4 11/01/2019   GFR 79.26 11/01/2019   Lab Results  Component Value Date   CHOL 186 11/01/2019   Lab Results  Component Value Date   HDL 45.90 11/01/2019   Lab Results  Component Value Date   LDLCALC 128 (H) 11/01/2019   Lab Results  Component Value Date   TRIG 63.0 11/01/2019   Lab Results  Component Value Date   CHOLHDL 4 11/01/2019   Lab Results  Component Value Date   HGBA1C 6.3 06/29/2020      Assessment & Plan:   Problem List Items Addressed This Visit      Genitourinary   Benign prostatic hyperplasia without lower urinary tract symptoms     Other   Elevated PSA - Primary   Relevant Medications   sulfamethoxazole-trimethoprim (BACTRIM DS) 800-160 MG tablet   Other Relevant Orders   PSA (Completed)   Prediabetes   Relevant Orders   Hemoglobin A1c  (Completed)   Non-recurrent unilateral inguinal hernia without obstruction or gangrene   Relevant Orders   Ambulatory referral to General Surgery   Perineum pain, male   Relevant Medications   sulfamethoxazole-trimethoprim (BACTRIM DS) 800-160 MG tablet   Other Relevant Orders   Urinalysis, Routine w reflex microscopic (Completed)   Urine cytology ancillary only (Completed)   Urine Culture (Completed)   Microcytic anemia   Relevant Orders   CBC (Completed)   Iron, TIBC and Ferritin Panel (Completed)      Meds ordered this encounter  Medications  . sulfamethoxazole-trimethoprim (BACTRIM DS) 800-160 MG tablet    Sig: Take 1 tablet by mouth 2 (two) times daily for 10 days.    Dispense:  20 tablet    Refill:  0    Follow-up: Return in about 3 months (around 09/29/2020).  Given information on BPH and inguinal hernia.  Inguinal hernia not a usual because of perineal discomfort, I think?  Rechecking PSA.  Status post normal biopsy a few years ago.    Libby Maw, MD

## 2020-06-30 LAB — PSA: PSA: 4.99 ng/mL — ABNORMAL HIGH (ref 0.10–4.00)

## 2020-06-30 LAB — CBC
HCT: 40.2 % (ref 39.0–52.0)
Hemoglobin: 12.6 g/dL — ABNORMAL LOW (ref 13.0–17.0)
MCHC: 31.4 g/dL (ref 30.0–36.0)
MCV: 76.5 fl — ABNORMAL LOW (ref 78.0–100.0)
Platelets: 220 10*3/uL (ref 150.0–400.0)
RBC: 5.25 Mil/uL (ref 4.22–5.81)
RDW: 14.6 % (ref 11.5–15.5)
WBC: 3.4 10*3/uL — ABNORMAL LOW (ref 4.0–10.5)

## 2020-06-30 LAB — URINALYSIS, ROUTINE W REFLEX MICROSCOPIC
Bilirubin Urine: NEGATIVE
Hgb urine dipstick: NEGATIVE
Ketones, ur: NEGATIVE
Leukocytes,Ua: NEGATIVE
Nitrite: NEGATIVE
RBC / HPF: NONE SEEN (ref 0–?)
Specific Gravity, Urine: 1.025 (ref 1.000–1.030)
Total Protein, Urine: NEGATIVE
Urine Glucose: NEGATIVE
Urobilinogen, UA: 0.2 (ref 0.0–1.0)
pH: 6 (ref 5.0–8.0)

## 2020-06-30 LAB — IRON,TIBC AND FERRITIN PANEL
%SAT: 37 % (calc) (ref 20–48)
Ferritin: 218 ng/mL (ref 38–380)
Iron: 98 ug/dL (ref 50–180)
TIBC: 267 mcg/dL (calc) (ref 250–425)

## 2020-06-30 LAB — URINE CULTURE
MICRO NUMBER:: 10803015
Result:: NO GROWTH
SPECIMEN QUALITY:: ADEQUATE

## 2020-06-30 LAB — HEMOGLOBIN A1C: Hgb A1c MFr Bld: 6.3 % (ref 4.6–6.5)

## 2020-07-02 LAB — URINE CYTOLOGY ANCILLARY ONLY
Bacterial Vaginitis-Urine: NEGATIVE
Chlamydia: NEGATIVE
Comment: NEGATIVE

## 2020-07-06 MED ORDER — SULFAMETHOXAZOLE-TRIMETHOPRIM 800-160 MG PO TABS: 1.0000 | ORAL_TABLET | Freq: Two times a day (BID) | ORAL | 0 refills | Status: AC

## 2020-07-06 NOTE — Addendum Note (Signed)
Addended by: Jon Billings on: 07/06/2020 08:03 AM   Modules accepted: Orders

## 2020-09-29 ENCOUNTER — Ambulatory Visit: Payer: 59 | Admitting: Family Medicine

## 2020-11-30 ENCOUNTER — Ambulatory Visit: Payer: 59 | Admitting: Family Medicine

## 2021-01-12 ENCOUNTER — Ambulatory Visit: Payer: 59 | Admitting: Family Medicine

## 2021-03-09 ENCOUNTER — Other Ambulatory Visit: Payer: Self-pay

## 2021-03-09 ENCOUNTER — Encounter: Payer: Self-pay | Admitting: Family Medicine

## 2021-03-09 ENCOUNTER — Ambulatory Visit (INDEPENDENT_AMBULATORY_CARE_PROVIDER_SITE_OTHER): Payer: 59 | Admitting: Family Medicine

## 2021-03-09 VITALS — BP 158/82 | HR 69 | Temp 98.0°F | Ht 71.0 in | Wt 191.4 lb

## 2021-03-09 DIAGNOSIS — R7303 Prediabetes: Secondary | ICD-10-CM | POA: Diagnosis not present

## 2021-03-09 DIAGNOSIS — H9113 Presbycusis, bilateral: Secondary | ICD-10-CM

## 2021-03-09 DIAGNOSIS — N400 Enlarged prostate without lower urinary tract symptoms: Secondary | ICD-10-CM | POA: Diagnosis not present

## 2021-03-09 DIAGNOSIS — R972 Elevated prostate specific antigen [PSA]: Secondary | ICD-10-CM

## 2021-03-09 DIAGNOSIS — D509 Iron deficiency anemia, unspecified: Secondary | ICD-10-CM | POA: Diagnosis not present

## 2021-03-09 DIAGNOSIS — R030 Elevated blood-pressure reading, without diagnosis of hypertension: Secondary | ICD-10-CM

## 2021-03-09 LAB — PSA: PSA: 8.94 ng/mL — ABNORMAL HIGH (ref 0.10–4.00)

## 2021-03-09 LAB — URINALYSIS, ROUTINE W REFLEX MICROSCOPIC
Bilirubin Urine: NEGATIVE
Hgb urine dipstick: NEGATIVE
Ketones, ur: NEGATIVE
Leukocytes,Ua: NEGATIVE
Nitrite: NEGATIVE
RBC / HPF: NONE SEEN (ref 0–?)
Specific Gravity, Urine: 1.015 (ref 1.000–1.030)
Total Protein, Urine: NEGATIVE
Urine Glucose: NEGATIVE
Urobilinogen, UA: 0.2 (ref 0.0–1.0)
WBC, UA: NONE SEEN (ref 0–?)
pH: 7 (ref 5.0–8.0)

## 2021-03-09 LAB — B12 AND FOLATE PANEL
Folate: 13.9 ng/mL (ref >5.9)
Vitamin B-12: 566 pg/mL (ref 211–911)

## 2021-03-09 LAB — CBC
HCT: 41.4 % (ref 39.0–52.0)
Hemoglobin: 13.1 g/dL (ref 13.0–17.0)
MCHC: 31.7 g/dL (ref 30.0–36.0)
MCV: 74.6 fl — ABNORMAL LOW (ref 78.0–100.0)
Platelets: 218 10*3/uL (ref 150.0–400.0)
RBC: 5.55 Mil/uL (ref 4.22–5.81)
RDW: 14.4 % (ref 11.5–15.5)
WBC: 3.8 10*3/uL — ABNORMAL LOW (ref 4.0–10.5)

## 2021-03-09 LAB — HEMOGLOBIN A1C: Hgb A1c MFr Bld: 6.3 % (ref 4.6–6.5)

## 2021-03-09 LAB — BASIC METABOLIC PANEL
BUN: 14 mg/dL (ref 6–23)
CO2: 28 mEq/L (ref 19–32)
Calcium: 9.3 mg/dL (ref 8.4–10.5)
Chloride: 102 mEq/L (ref 96–112)
Creatinine, Ser: 1.01 mg/dL (ref 0.40–1.50)
GFR: 83.56 mL/min (ref >60.00)
Glucose, Bld: 100 mg/dL — ABNORMAL HIGH (ref 70–99)
Potassium: 4.9 mEq/L (ref 3.5–5.1)
Sodium: 138 mEq/L (ref 135–145)

## 2021-03-09 NOTE — Patient Instructions (Signed)
Diabetes Care, 44(Suppl 1), D40-C14. https://doi.org/https://doi.org/10.2337/dc21-S003">  Prediabetes Prediabetes is when your blood sugar (blood glucose) level is higher than normal but not high enough for you to be diagnosed with type 2 diabetes. Having prediabetes puts you at risk for developing type 2 diabetes (type 2 diabetes mellitus). With certain lifestyle changes, you may be able to prevent or delay the onset of type 2 diabetes. This is important because type 2 diabetes can lead to serious complications, such as:  Heart disease.  Stroke.  Blindness.  Kidney disease.  Depression.  Poor circulation in the feet and legs. In severe cases, this could lead to surgical removal of a leg (amputation). What are the causes? The exact cause of prediabetes is not known. It may result from insulin resistance. Insulin resistance develops when cells in the body do not respond properly to insulin that the body makes. This can cause excess glucose to build up in the blood. High blood glucose (hyperglycemia) can develop. What increases the risk? The following factors may make you more likely to develop this condition:  You have a family member with type 2 diabetes.  You are older than 45 years.  You had a temporary form of diabetes during a pregnancy (gestational diabetes).  You had polycystic ovary syndrome (PCOS).  You are overweight or obese.  You are inactive (sedentary).  You have a history of heart disease, including problems with cholesterol levels, high levels of blood fats, or high blood pressure. What are the signs or symptoms? You may have no symptoms. If you do have symptoms, they may include:  Increased hunger.  Increased thirst.  Increased urination.  Vision changes, such as blurry vision.  Tiredness (fatigue). How is this diagnosed? This condition can be diagnosed with blood tests. Your blood glucose may be checked with one or more of the following tests:  A  fasting blood glucose (FBG) test. You will not be allowed to eat (you will fast) for at least 8 hours before a blood sample is taken.  An A1C blood test (hemoglobin A1C). This test provides information about blood glucose levels over the previous 2?3 months.  An oral glucose tolerance test (OGTT). This test measures your blood glucose at two points in time: ? After fasting. This is your baseline level. ? Two hours after you drink a beverage that contains glucose. You may be diagnosed with prediabetes if:  Your FBG is 100?125 mg/dL (5.6-6.9 mmol/L).  Your A1C level is 5.7?6.4% (39-46 mmol/mol).  Your OGTT result is 140?199 mg/dL (7.8-11 mmol/L). These blood tests may be repeated to confirm your diagnosis.   How is this treated? Treatment may include dietary and lifestyle changes to help lower your blood glucose and prevent type 2 diabetes from developing. In some cases, medicine may be prescribed to help lower the risk of type 2 diabetes. Follow these instructions at home: Nutrition  Follow a healthy meal plan. This includes eating lean proteins, whole grains, legumes, fresh fruits and vegetables, low-fat dairy products, and healthy fats.  Follow instructions from your health care provider about eating or drinking restrictions.  Meet with a dietitian to create a healthy eating plan that is right for you.   Lifestyle  Do moderate-intensity exercise for at least 30 minutes a day on 5 or more days each week, or as told by your health care provider. A mix of activities may be best, such as: ? Brisk walking, swimming, biking, and weight lifting.  Lose weight as told by your health  care provider. Losing 5-7% of your body weight can reverse insulin resistance.  Do not drink alcohol if: ? Your health care provider tells you not to drink. ? You are pregnant, may be pregnant, or are planning to become pregnant.  If you drink alcohol: ? Limit how much you use to:  0-1 drink a day for  women.  0-2 drinks a day for men. ? Be aware of how much alcohol is in your drink. In the [place] one drink equals one 12 oz bottle of beer (355 mL), one 5 oz glass of wine (148 mL), or one 1 oz glass of hard liquor (44 mL). General instructions  Take over-the-counter and prescription medicines only as told by your health care provider. You may be prescribed medicines that help lower the risk of type 2 diabetes.  Do not use any products that contain nicotine or tobacco, such as cigarettes, e-cigarettes, and chewing tobacco. If you need help quitting, ask your health care provider.  Keep all follow-up visits. This is important. Where to find more information  American Diabetes Association: www.diabetes.org  Academy of Nutrition and Dietetics: www.eatright.org  American Heart Association: www.heart.org Contact a health care provider if:  You have any of these symptoms: ? Increased hunger. ? Increased urination. ? Increased thirst. ? Fatigue. ? Vision changes, such as blurry vision. Get help right away if you:  Have shortness of breath.  Feel confused.  Vomit or feel like you may vomit. Summary  Prediabetes is when your blood sugar (blood glucose)level is higher than normal but not high enough for you to be diagnosed with type 2 diabetes.  Having prediabetes puts you at risk for developing type 2 diabetes (type 2 diabetes mellitus).  Make lifestyle changes such as eating a healthy diet and exercising regularly to help prevent diabetes. Lose weight as told by your health care provider. This information is not intended to replace advice given to you by your health care provider. Make sure you discuss any questions you have with your health care provider. Document Revised: 02/06/2020 Document Reviewed: 02/06/2020 Elsevier Patient Education  2021 [place].  Preventing Hypertension Hypertension, also called high blood pressure, is when the force of blood pumping through the  arteries is too strong. Arteries are blood vessels that carry blood from the heart throughout the body. Often, hypertension does not cause symptoms until blood pressure is very high. It is important to have your blood pressure checked regularly. Diet and lifestyle changes can help you prevent hypertension, and they may make you feel better overall and improve your quality of life. If you already have hypertension, you may control it with diet and lifestyle changes, as well as with medicine. How can this condition affect me? Over time, hypertension can damage the arteries and decrease blood flow to important parts of the body, including the brain, heart, and kidneys. By keeping your blood pressure in a healthy range, you can help prevent complications like heart attack, heart failure, stroke, kidney failure, and vascular dementia. What can increase my risk?  Being an older adult. Older people are more often affected.  Having family members who have had high blood pressure.  Being obese.  Being male. Males are more likely to have high blood pressure.  Drinking too much alcohol or caffeine.  Smoking or using illegal drugs.  Taking certain medicines, such as antidepressants, decongestants, birth control pills, and NSAIDs, such as ibuprofen.  Having thyroid problems.  Having certain tumors. What actions can I take  to prevent or manage this condition? Work with your health care provider to make a hypertension prevention plan that works for you. Follow your plan and keep all follow-up visits as told by your health care provider. Diet changes Maintain a healthy diet. This includes:  Eating less salt (sodium). Ask your health care provider how much sodium is safe for you to have. The general recommendation is to have less than 1 tsp (2,300 mg) of sodium a day. ? Do not add salt to your food. ? Choose low-sodium options when grocery shopping and eating out.  Limiting fats in your diet. You can  do this by eating low-fat or fat-free dairy products and by eating less red meat.  Eating more fruits, vegetables, and whole grains. Make a goal to eat: ? 1-2 cups of fresh fruits and vegetables each day. ? 3-4 servings of whole grains each day.  Avoiding foods and beverages that have added sugars.  Eating fish that contain healthy fats (omega-3 fatty acids), such as mackerel or salmon. If you need help putting together a healthy eating plan, try the DASH diet. This diet is high in fruits, vegetables, and whole grains. It is low in sodium, red meat, and added sugars. DASH stands for Dietary Approaches to Stop Hypertension.   Lifestyle changes Lose weight if you are overweight. Losing just 3?5% of your body weight can help prevent or control hypertension. For example, if your present weight is 200 lb (91 kg), a loss of 3-5% of your weight means losing 6-10 lb (2.7-4.5 kg). Ask your health care provider to help you with a diet and exercise plan to safely lose weight. Other recommendations usually include:  Get enough exercise. Do at least 150 minutes of moderate-intensity exercise each week. You could do this in short exercise sessions several times a day, or you could do longer exercise sessions a few times a week. For example, you could take a brisk 10-minute walk or bike ride, 3 times a day, for 5 days a week.  Find ways to reduce stress, such as exercising, meditating, listening to music, or taking a yoga class. If you need help reducing stress, ask your health care provider.  Do not use any products that contain nicotine or tobacco, such as cigarettes, e-cigarettes, and chewing tobacco. If you need help quitting, ask your health care provider. Chemicals in tobacco and nicotine products raise your blood pressure each time you use them. If you need help quitting, ask your health care provider.  Learn how to check your blood pressure at home. Make sure that you know your personal target blood  pressure, as told by your health care provider.  Try to sleep 7-9 hours per night.   Alcohol use  Do not drink alcohol if: ? Your health care provider tells you not to drink. ? You are pregnant, may be pregnant, or are planning to become pregnant.  If you drink alcohol: ? Limit how much you use to:  0-1 drink a day for women.  0-2 drinks a day for men. ? Be aware of how much alcohol is in your drink. In the [place] one drink equals one 12 oz bottle of beer (355 mL), one 5 oz glass of wine (148 mL), or one 1 oz glass of hard liquor (44 mL). Medicines In addition to diet and lifestyle changes, your health care provider may recommend medicines to help lower your blood pressure. In general:  You may need to try a few different medicines  to find what works best for you.  You may need to take more than one medicine.  Take over-the-counter and prescription medicines only as told by your health care provider. Questions to ask your health care provider  What is my blood pressure goal?  How can I lower my risk for high blood pressure?  How should I monitor my blood pressure at home? Where to find support Your health care provider can help you prevent hypertension and help you keep your blood pressure at a healthy level. Your local hospital or your community may also provide support services and prevention programs. The American Heart Association offers an online support network at supportnetwork.heart.org Where to find more information Learn more about hypertension from:  [place], Lung, and [place]: https://wilson-eaton.com/  Centers for Disease Control and Prevention: http://www.wolf.info/  American Academy of Family Physicians: familydoctor.org Learn more about the DASH diet from:  [place], Lung, and [place]: https://wilson-eaton.com/ Contact a health care provider if:  You think you are having a reaction to medicines you have taken.  You have recurrent headaches or feel  dizzy.  You have swelling in your ankles.  You have trouble with your vision. Get help right away if:  You have sudden, severe chest, back, or abdominal pain or discomfort.  You have shortness of breath.  You have a sudden, severe headache. These symptoms may represent a serious problem that is an emergency. Do not wait to see if the symptoms will go away. Get medical help right away. Call your local emergency services (911 in the [place] Do not drive yourself to the hospital.  Summary  Hypertension often does not cause any symptoms until blood pressure is very high. It is important to get your blood pressure checked regularly.  Diet and lifestyle changes are important steps in preventing hypertension.  By keeping your blood pressure in a healthy range, you may prevent complications like heart attack, heart failure, stroke, and kidney failure.  Work with your health care provider to make a hypertension prevention plan that works for you. This information is not intended to replace advice given to you by your health care provider. Make sure you discuss any questions you have with your health care provider. Document Revised: 10/08/2019 Document Reviewed: 10/08/2019 Elsevier Patient Education  2021 [employer].

## 2021-03-09 NOTE — Progress Notes (Addendum)
Established Patient Office Visit  Subjective:  Patient ID: William Jacobson, male    DOB: 17-Jul-1966  Age: 55 y.o. MRN: [id number]  CC:  Chief Complaint  Patient presents with  . Follow-up    Routine follow up on labs, no concerns.     HPI William Jacobson presents for follow-up of BPH with elevated PSA, elevated hemoglobin A1c, microcytic anemia.  Urine flow has been good.  Denies decreased force of stream or difficulty emptying the bladder.  Pees more frequently when he consumes more fluids.  Concerned about hearing loss secondary to noise exposure.  Denies tinnitus.  Mom has diabetes that is well controlled.  She remains active at age 28.  Patient is a proponent of a positive attitude!  Past Medical History:  Diagnosis Date  . Contact dermatitis and other eczema, due to unspecified cause   . Pain in joint, lower leg    pain in right knee- torn minisicus lateral  . Personal history of other diseases of digestive system    history of hemorrhoids    Past Surgical History:  Procedure Laterality Date  . COLONOSCOPY  01/01/2016   4 polyps  . KNEE ARTHROSCOPY  2008   arthroscopy right knee torn laterlal  miniscus  . POLYPECTOMY    . PROSTATE BIOPSY  2019    Family History  Problem Relation Age of Onset  . Diabetes Mother   . Healthy Father   . Heart attack Maternal Grandmother   . Alcohol abuse Maternal Grandfather   . Cirrhosis Maternal Grandfather   . Colon cancer Neg Hx   . Colon polyps Neg Hx   . Esophageal cancer Neg Hx   . Rectal cancer Neg Hx   . Stomach cancer Neg Hx     Social History   Socioeconomic History  . Marital status: Married    Spouse name: Not on file  . Number of children: 3  . Years of education: 62  . Highest education level: Not on file  Occupational History  . Occupation: Maintenance Superviser   Tobacco Use  . Smoking status: Never Smoker  . Smokeless tobacco: Never Used  Vaping Use  . Vaping Use: Never used  Substance and  Sexual Activity  . Alcohol use: Yes    Alcohol/week: 0.0 standard drinks    Comment: Occasionally, special occasion  . Drug use: No  . Sexual activity: Yes    Partners: Female  Other Topics Concern  . Not on file  Social History Narrative   HSG, [employer] [place]. 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 MARRIAGE- HITTING A LITTLE TOUGH SPOT RE: MONEY, PARENTING DIFFICULTIES WITH HIS OLDER SON FROM PREVIOUS MARRIAGE   Social Determinants of Health   Financial Resource Strain: Not on file  Food Insecurity: Not on file  Transportation Needs: Not on file  Physical Activity: Not on file  Stress: Not on file  Social Connections: Not on file  Intimate Partner Violence: Not on file    No outpatient medications prior to visit.   No facility-administered medications prior to visit.    No Known Allergies  ROS Review of Systems  Constitutional: Negative.   HENT: Negative.   Eyes: Negative for photophobia and visual disturbance.  Respiratory: Negative.   Cardiovascular: Negative.   Gastrointestinal: Negative.   Endocrine: Negative for polyphagia and polyuria.  Genitourinary: Negative.   Musculoskeletal: Negative.   Skin: Negative for pallor and rash.  Neurological: Negative for speech difficulty and weakness.  Psychiatric/Behavioral: Negative.       Objective:    Physical Exam Vitals and nursing note reviewed.  Constitutional:      General: He is not in acute distress.    Appearance: Normal appearance. He is not ill-appearing, toxic-appearing or diaphoretic.  HENT:     Head: Normocephalic and atraumatic.     Right Ear: Tympanic membrane, ear canal and external ear normal.     Left Ear: Tympanic membrane, ear canal and external ear normal.     Mouth/Throat:     Mouth: Mucous membranes are moist.     Pharynx: Oropharynx is clear. No oropharyngeal exudate or posterior oropharyngeal erythema.  Eyes:     General: No scleral icterus.        Right eye: No discharge.        Left eye: No discharge.     Conjunctiva/sclera: Conjunctivae normal.     Pupils: Pupils are equal, round, and reactive to light.  Cardiovascular:     Rate and Rhythm: Normal rate and regular rhythm.  Pulmonary:     Effort: Pulmonary effort is normal.     Breath sounds: Normal breath sounds.  Abdominal:     General: Bowel sounds are normal.  Musculoskeletal:     Cervical back: No rigidity or tenderness.     Right lower leg: No edema.     Left lower leg: No edema.  Lymphadenopathy:     Cervical: No cervical adenopathy.  Skin:    General: Skin is warm and dry.  Neurological:     Mental Status: He is alert and oriented to person, place, and time.  Psychiatric:        Mood and Affect: Mood normal.        Behavior: Behavior normal.     BP (!) 158/82   Pulse 69   Temp 98 F (36.7 C) (Temporal)   Ht 5\' 11"  (1.803 m)   Wt 191 lb 6.4 oz (86.8 kg)   SpO2 98%   BMI 26.69 kg/m  Wt Readings from Last 3 Encounters:  03/09/21 191 lb 6.4 oz (86.8 kg)  06/29/20 184 lb (83.5 kg)  03/03/20 193 lb (87.5 kg)     Health Maintenance Due  Topic Date Due  . Hepatitis C Screening  Never done    There are no preventive care reminders to display for this patient.  Lab Results  Component Value Date   TSH 1.22 06/10/2010   Lab Results  Component Value Date   WBC 3.8 (L) 03/09/2021   HGB 13.1 03/09/2021   HCT 41.4 03/09/2021   MCV 74.6 (L) 03/09/2021   PLT 218.0 03/09/2021   Lab Results  Component Value Date   NA 138 03/09/2021   K 4.9 03/09/2021   CO2 28 03/09/2021   GLUCOSE 100 (H) 03/09/2021   BUN 14 03/09/2021   CREATININE 1.01 03/09/2021   BILITOT 1.0 11/01/2019   ALKPHOS 64 11/01/2019   AST 19 11/01/2019   ALT 25 11/01/2019   PROT 6.9 11/01/2019   ALBUMIN 4.5 11/01/2019   CALCIUM 9.3 03/09/2021   GFR 83.56 03/09/2021   Lab Results  Component Value Date   CHOL 186 11/01/2019   Lab Results  Component Value Date   HDL 45.90  11/01/2019   Lab Results  Component Value Date   LDLCALC 128 (H) 11/01/2019   Lab Results  Component Value Date   TRIG 63.0 11/01/2019   Lab Results  Component Value Date   CHOLHDL 4 11/01/2019   Lab Results  Component Value Date   HGBA1C 6.3 03/09/2021      Assessment & Plan:   Problem List Items Addressed This Visit      Nervous and Auditory   Presbycusis of both ears     Genitourinary   Benign prostatic hyperplasia without lower urinary tract symptoms   Relevant Orders   Urinalysis, Routine w reflex microscopic (Completed)   PSA (Completed)     Other   Elevated PSA - Primary   Relevant Orders   PSA (Completed)   Ambulatory referral to Urology   Prediabetes   Relevant Orders   Hemoglobin A1c (Completed)   Microcytic anemia   Relevant Orders   CBC (Completed)   Iron, TIBC and Ferritin Panel (Completed)   B12 and Folate Panel (Completed)   Hgb Fractionation Cascade (Completed)   Elevated BP without diagnosis of hypertension   Relevant Orders   Basic metabolic panel (Completed)      No orders of the defined types were placed in this encounter.   Follow-up: Return in about 3 months (around 06/08/2021).  Asked patient to lose weight.  Given information on prediabetes and preventing hypertension.  [facility] will go to [facility] for a free hearing test.  With his weight gain I am concerned that his hemoglobin A1c might of crossed over into the diabetic range.  We discussed starting a medicine that is the case.  Libby Maw, MD

## 2021-03-11 LAB — IRON,TIBC AND FERRITIN PANEL
%SAT: 31 % (calc) (ref 20–48)
Ferritin: 229 ng/mL (ref 38–380)
Iron: 85 ug/dL (ref 50–180)
TIBC: 273 mcg/dL (calc) (ref 250–425)

## 2021-03-11 LAB — HGB FRACTIONATION CASCADE
Hgb A2: 2.1 % (ref 1.8–3.2)
Hgb A: 97.9 % (ref 96.4–98.8)
Hgb F: 0 % (ref 0.0–2.0)
Hgb S: 0 %

## 2021-03-15 NOTE — Addendum Note (Signed)
Addended by: Jon Billings on: 03/15/2021 03:08 PM   Modules accepted: Orders

## 2021-03-30 ENCOUNTER — Other Ambulatory Visit: Payer: Self-pay | Admitting: Urology

## 2021-03-30 DIAGNOSIS — R972 Elevated prostate specific antigen [PSA]: Secondary | ICD-10-CM

## 2021-04-14 ENCOUNTER — Other Ambulatory Visit: Payer: 59

## 2021-04-24 ENCOUNTER — Other Ambulatory Visit: Payer: Self-pay

## 2021-04-24 ENCOUNTER — Ambulatory Visit
Admission: RE | Admit: 2021-04-24 | Discharge: 2021-04-24 | Disposition: A | Discharge status: Home / Self Care | Payer: 59 | Source: Ambulatory Visit | Attending: Urology | Admitting: Urology

## 2021-04-24 DIAGNOSIS — R972 Elevated prostate specific antigen [PSA]: Secondary | ICD-10-CM

## 2021-04-24 MED ORDER — GADOBENATE DIMEGLUMINE 529 MG/ML IV SOLN
19.0000 mL | Freq: Once | INTRAVENOUS | Status: AC | PRN
  Administered 2021-04-24: 19 mL via INTRAVENOUS

## 2021-06-07 ENCOUNTER — Other Ambulatory Visit: Payer: Self-pay

## 2021-06-08 ENCOUNTER — Ambulatory Visit (INDEPENDENT_AMBULATORY_CARE_PROVIDER_SITE_OTHER): Payer: 59 | Admitting: Family Medicine

## 2021-06-08 ENCOUNTER — Encounter: Payer: Self-pay | Admitting: Family Medicine

## 2021-06-08 VITALS — BP 166/92 | HR 67 | Temp 97.3°F | Ht 71.0 in | Wt 190.0 lb

## 2021-06-08 DIAGNOSIS — I10 Essential (primary) hypertension: Secondary | ICD-10-CM

## 2021-06-08 DIAGNOSIS — R7303 Prediabetes: Secondary | ICD-10-CM

## 2021-06-08 LAB — HEMOGLOBIN A1C: Hgb A1c MFr Bld: 6.3 % (ref 4.6–6.5)

## 2021-06-08 MED ORDER — AMLODIPINE BESYLATE 5 MG PO TABS: 5.0000 mg | ORAL_TABLET | Freq: Every day | ORAL | 1 refills | Status: DC

## 2021-06-08 NOTE — Progress Notes (Signed)
Established Patient Office Visit  Subjective:  Patient ID: William Jacobson, male    DOB: 1965/12/18  Age: 55 y.o. MRN: [id number]  CC:  Chief Complaint  Patient presents with   Follow-up    3 month follow up on labs, no concerns.     HPI [name] presents for follow-up of blood pressure and prediabetes.  Did see Dr. Gloriann Loan.  Patient had discontinued all caffeine in his diet.  His PSA fell by 2 points.  Dr. Gloriann Loan will see him back in a year.  Patient is really trying to decrease the sodium and carbohydrate in his diet.  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  No family history of hypertension he tells me.  Past Medical History:  Diagnosis Date   Contact dermatitis and other eczema, due to unspecified cause    Pain in joint, lower leg    pain in right knee- torn minisicus lateral   Personal history of other diseases of digestive system    history of hemorrhoids    Past Surgical History:  Procedure Laterality Date   COLONOSCOPY  01/01/2016   4 polyps   KNEE ARTHROSCOPY  2008   arthroscopy right knee torn laterlal  miniscus   POLYPECTOMY     PROSTATE BIOPSY  2019    Family History  Problem Relation Age of Onset   Diabetes Mother    Healthy Father    Heart attack Maternal Grandmother    Alcohol abuse Maternal Grandfather    Cirrhosis Maternal Grandfather    Colon cancer Neg Hx    Colon polyps Neg Hx    Esophageal cancer Neg Hx    Rectal cancer Neg Hx    Stomach cancer Neg Hx     Social History   Socioeconomic History   Marital status: Married    Spouse name: Not on file   Number of children: 3   Years of education: 16   Highest education level: Not on file  Occupational History   Occupation: Maintenance Superviser   Tobacco Use   Smoking status: Never   Smokeless tobacco: Never  Vaping Use   Vaping Use: Never used  Substance and Sexual Activity   Alcohol use: Yes    Alcohol/week: 0.0 standard drinks    Comment:  Occasionally, special occasion   Drug use: No   Sexual activity: Yes    Partners: Female  Other Topics Concern   Not on file  Social History Narrative   HSG, [employer] [place]. 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 MARRIAGE- HITTING A LITTLE TOUGH SPOT RE: MONEY, PARENTING DIFFICULTIES WITH HIS OLDER SON FROM PREVIOUS MARRIAGE   Social Determinants of Health   Financial Resource Strain: Not on file  Food Insecurity: Not on file  Transportation Needs: Not on file  Physical Activity: Not on file  Stress: Not on file  Social Connections: Not on file  Intimate Partner Violence: Not on file    No outpatient medications prior to visit.   No facility-administered medications prior to visit.    No Known Allergies  ROS Review of Systems  Constitutional: Negative.   HENT: Negative.    Eyes:  Negative for visual disturbance.  Respiratory: Negative.    Cardiovascular: Negative.   Gastrointestinal: Negative.   Genitourinary: Negative.   Neurological:  Negative for weakness and headaches.  Psychiatric/Behavioral: Negative.       Objective:  Physical Exam Vitals and nursing note reviewed.  Constitutional:      General: He is not in acute distress.    Appearance: Normal appearance. He is normal weight. He is not ill-appearing, toxic-appearing or diaphoretic.  Eyes:     Conjunctiva/sclera: Conjunctivae normal.     Pupils: Pupils are equal, round, and reactive to light.  Cardiovascular:     Rate and Rhythm: Normal rate and regular rhythm.  Pulmonary:     Effort: Pulmonary effort is normal.     Breath sounds: Normal breath sounds.  Skin:    General: Skin is warm and dry.  Neurological:     Mental Status: He is alert and oriented to person, place, and time.  Psychiatric:        Mood and Affect: Mood normal.        Behavior: Behavior normal.    BP (!) 166/92   Pulse 67   Temp (!) 97.3 F (36.3 C) (Temporal)   Ht 5\' 11"  (1.803 m)    Wt 190 lb (86.2 kg)   SpO2 100%   BMI 26.50 kg/m  Wt Readings from Last 3 Encounters:  06/08/21 190 lb (86.2 kg)  03/09/21 191 lb 6.4 oz (86.8 kg)  06/29/20 184 lb (83.5 kg)     Health Maintenance Due  Topic Date Due   Hepatitis C Screening  Never done   Zoster Vaccines- Shingrix (1 of 2) Never done    There are no preventive care reminders to display for this patient.  Lab Results  Component Value Date   TSH 1.22 06/10/2010   Lab Results  Component Value Date   WBC 3.8 (L) 03/09/2021   HGB 13.1 03/09/2021   HCT 41.4 03/09/2021   MCV 74.6 (L) 03/09/2021   PLT 218.0 03/09/2021   Lab Results  Component Value Date   NA 138 03/09/2021   K 4.9 03/09/2021   CO2 28 03/09/2021   GLUCOSE 100 (H) 03/09/2021   BUN 14 03/09/2021   CREATININE 1.01 03/09/2021   BILITOT 1.0 11/01/2019   ALKPHOS 64 11/01/2019   AST 19 11/01/2019   ALT 25 11/01/2019   PROT 6.9 11/01/2019   ALBUMIN 4.5 11/01/2019   CALCIUM 9.3 03/09/2021   GFR 83.56 03/09/2021   Lab Results  Component Value Date   CHOL 186 11/01/2019   Lab Results  Component Value Date   HDL 45.90 11/01/2019   Lab Results  Component Value Date   LDLCALC 128 (H) 11/01/2019   Lab Results  Component Value Date   TRIG 63.0 11/01/2019   Lab Results  Component Value Date   CHOLHDL 4 11/01/2019   Lab Results  Component Value Date   HGBA1C 6.3 03/09/2021      Assessment & Plan:   Problem List Items Addressed This Visit       Other   Prediabetes   Relevant Orders   Hemoglobin A1c   Other Visit Diagnoses     Essential hypertension    -  Primary   Relevant Medications   amLODipine (NORVASC) 5 MG tablet       Meds ordered this encounter  Medications   amLODipine (NORVASC) 5 MG tablet    Sig: Take 1 tablet (5 mg total) by mouth daily.    Dispense:  90 tablet    Refill:  1    Follow-up: Return in about 3 months (around 09/08/2021).   BP has been elevated over the last visits.  Believe it is  time to  start medication.  Have chosen amlodipine.  Patient will look out for any swelling in his legs.  Continue low-sodium diet.  Given information on amlodipine as well as managing hypertension.  Libby Maw, MD

## 2021-08-13 ENCOUNTER — Other Ambulatory Visit: Payer: Self-pay

## 2021-08-13 ENCOUNTER — Ambulatory Visit (INDEPENDENT_AMBULATORY_CARE_PROVIDER_SITE_OTHER): Payer: 59 | Admitting: Family Medicine

## 2021-08-13 ENCOUNTER — Ambulatory Visit (INDEPENDENT_AMBULATORY_CARE_PROVIDER_SITE_OTHER): Payer: 59

## 2021-08-13 ENCOUNTER — Encounter: Payer: Self-pay | Admitting: Family Medicine

## 2021-08-13 VITALS — BP 160/90 | HR 84 | Temp 97.1°F | Resp 17 | Wt 186.2 lb

## 2021-08-13 DIAGNOSIS — M79645 Pain in left finger(s): Secondary | ICD-10-CM | POA: Diagnosis not present

## 2021-08-13 DIAGNOSIS — M25562 Pain in left knee: Secondary | ICD-10-CM

## 2021-08-13 DIAGNOSIS — I10 Essential (primary) hypertension: Secondary | ICD-10-CM | POA: Diagnosis not present

## 2021-08-13 DIAGNOSIS — Z87898 Personal history of other specified conditions: Secondary | ICD-10-CM | POA: Diagnosis not present

## 2021-08-13 MED ORDER — AMLODIPINE BESYLATE 10 MG PO TABS: 10.0000 mg | ORAL_TABLET | Freq: Every day | ORAL | 0 refills | Status: DC

## 2021-08-13 NOTE — Progress Notes (Addendum)
Established Patient Office Visit  Subjective:  Patient ID: William Jacobson, male    DOB: [birth year]  Age: 55 y.o. MRN: [id number]  CC:  Chief Complaint  Patient presents with   Acute Visit    Pt c/o left knee pain and swelling x3 week,pt denies injury to knee causing pain,  pt declines flu vaccine on today.     HPI William Jacobson presents for left knee pain and left fourth finger pain.  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  He developed tightness and swelling in his left knee about 2 to 3 weeks ago.  There was no specific injury.  Denies locking or giving way of his knee.  He has no distant history of injury to this knee.  Regarding his left fourth finger he sustained a severe laceration to the left fourth finger that involved an amputation of approximately 5 mm of the medial finger pad.  He has noted persisting paresthesias distal to the wound.  He shows me a picture of the original injury.  What is bothering most now is a persistent swelling in his PIP of this finger.  There is decreased range of motion.  Claims compliance with his amlodipine.  Thinks his BP may be elevated from increased work recently.  Having no issues taking medication.  Past Medical History:  Diagnosis Date   Contact dermatitis and other eczema, due to unspecified cause    Pain in joint, lower leg    pain in right knee- torn minisicus lateral   Personal history of other diseases of digestive system    history of hemorrhoids    Past Surgical History:  Procedure Laterality Date   COLONOSCOPY  01/01/2016   4 polyps   KNEE ARTHROSCOPY  2008   arthroscopy right knee torn laterlal  miniscus   POLYPECTOMY     PROSTATE BIOPSY  2019    Family History  Problem Relation Age of Onset   Diabetes Mother    Healthy Father    Heart attack Maternal Grandmother    Alcohol abuse Maternal Grandfather    Cirrhosis Maternal Grandfather     Colon cancer Neg Hx    Colon polyps Neg Hx    Esophageal cancer Neg Hx    Rectal cancer Neg Hx    Stomach cancer Neg Hx     Social History   Socioeconomic History   Marital status: Married    Spouse name: Not on file   Number of children: 3   Years of education: 16   Highest education level: Not on file  Occupational History   Occupation: Maintenance Superviser   Tobacco Use   Smoking status: Never   Smokeless tobacco: Never  Vaping Use   Vaping Use: Never used  Substance and Sexual Activity   Alcohol use: Yes    Alcohol/week: 0.0 standard drinks    Comment: Occasionally, special occasion   Drug use: No   Sexual activity: Yes    Partners: Female  Other Topics Concern   Not on file  Social History Narrative   HSG, [employer]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 MARRIAGE- HITTING A LITTLE TOUGH SPOT RE: MONEY, PARENTING DIFFICULTIES WITH HIS OLDER SON FROM PREVIOUS MARRIAGE   Social Determinants of Health   Financial Resource Strain: Not on file  Food Insecurity: Not on file  Transportation Needs: Not on file  Physical Activity: Not on file  Stress: Not on file  Social Connections: Not on file  Intimate Partner Violence: Not on file    Outpatient Medications Prior to Visit  Medication Sig Dispense Refill   amLODipine (NORVASC) 5 MG tablet Take 1 tablet (5 mg total) by mouth daily. (Patient not taking: Reported on 08/13/2021) 90 tablet 1   No facility-administered medications prior to visit.    No Known Allergies  ROS Review of Systems  Constitutional:  Negative for chills, diaphoresis, fatigue, fever and unexpected weight change.  Respiratory: Negative.    Cardiovascular: Negative.   Gastrointestinal: Negative.   Musculoskeletal:  Positive for arthralgias and joint swelling.  Neurological:  Positive for numbness. Negative for weakness.     Objective:    Physical Exam Vitals and nursing note reviewed.   Constitutional:      General: He is not in acute distress.    Appearance: Normal appearance. He is normal weight. He is not ill-appearing, toxic-appearing or diaphoretic.  HENT:     Head: Normocephalic and atraumatic.  Eyes:     General:        Right eye: No discharge.        Left eye: No discharge.     Conjunctiva/sclera: Conjunctivae normal.  Pulmonary:     Effort: Pulmonary effort is normal.  Musculoskeletal:     Left hand: Swelling present. No bony tenderness. Decreased range of motion. Normal strength. Decreased sensation.       Arms:     Left knee: Swelling and bony tenderness present. No effusion. Tenderness present over the medial joint line. No MCL laxity.    Instability Tests: Anterior drawer test negative. Posterior drawer test negative.     Comments: There is no TTP or swelling of the patellar tendon.  Negative grind test.  Tenderness to palpation along the medial joint line.  Skin:    General: Skin is warm and dry.  Neurological:     Mental Status: He is alert and oriented to person, place, and time.  Psychiatric:        Mood and Affect: Mood normal.        Behavior: Behavior normal.    BP (!) 160/90 (BP Location: Left Arm, Patient Position: Sitting, Cuff Size: Normal)   Pulse 84   Temp (!) 97.1 F (36.2 C) (Temporal)   Resp 17   Wt 186 lb 3.2 oz (84.5 kg)   SpO2 98%   BMI 25.97 kg/m  Wt Readings from Last 3 Encounters:  08/13/21 186 lb 3.2 oz (84.5 kg)  06/08/21 190 lb (86.2 kg)  03/09/21 191 lb 6.4 oz (86.8 kg)     Health Maintenance Due  Topic Date Due   Hepatitis C Screening  Never done   Zoster Vaccines- Shingrix (1 of 2) Never done   INFLUENZA VACCINE  Never done    There are no preventive care reminders to display for this patient.  Lab Results  Component Value Date   TSH 1.22 06/10/2010   Lab Results  Component Value Date   WBC 4.2 08/30/2021   HGB 13.2 08/30/2021   HCT 41.3 08/30/2021   MCV 74.3 (L) 08/30/2021   PLT 227.0  08/30/2021   Lab Results  Component Value Date   NA 138 03/09/2021   K 4.9 03/09/2021   CO2 28 03/09/2021   GLUCOSE 100 (H) 03/09/2021   BUN 14 03/09/2021   CREATININE 1.01 03/09/2021   BILITOT 1.0 11/01/2019  ALKPHOS 64 11/01/2019   AST 19 11/01/2019   ALT 25 11/01/2019   PROT 6.9 11/01/2019   ALBUMIN 4.5 11/01/2019   CALCIUM 9.3 03/09/2021   GFR 83.56 03/09/2021   Lab Results  Component Value Date   CHOL 186 11/01/2019   Lab Results  Component Value Date   HDL 45.90 11/01/2019   Lab Results  Component Value Date   LDLCALC 128 (H) 11/01/2019   Lab Results  Component Value Date   TRIG 63.0 11/01/2019   Lab Results  Component Value Date   CHOLHDL 4 11/01/2019   Lab Results  Component Value Date   HGBA1C 6.3 06/08/2021      Assessment & Plan:   Problem List Items Addressed This Visit       Other   History of elevated PSA   Relevant Orders   PSA (Completed)   Ambulatory referral to Urology   Other Visit Diagnoses     Left knee pain, unspecified chronicity    -  Primary   Relevant Orders   DG Knee Complete 4 Views Left (Completed)   Ambulatory referral to Orthopedic Surgery   Essential hypertension       Relevant Medications   amLODipine (NORVASC) 10 MG tablet   Pain in finger of left hand       Relevant Orders   DG Hand Complete Left (Completed)   Ambulatory referral to Orthopedic Surgery   CBC w/Diff (Completed)   C-reactive protein (Completed)   Sedimentation rate (Completed)       Meds ordered this encounter  Medications   amLODipine (NORVASC) 10 MG tablet    Sig: Take 1 tablet (10 mg total) by mouth daily.    Dispense:  90 tablet    Refill:  0    Follow-up: Return in about 2 months (around 10/13/2021).  Have increased amlodipine to 10 mg daily.  Advised to follow-up in 2 to 3 months.  Patient declined flu vaccine today.  Gave him information on immunizations appropriate for his age group.  He and his wife both are concerned about  his knee.  Suspect arthritis we will check knee films today.  Believe that he would like to go ahead and he is seeing orthopedist regarding this knee and his left fourth finger.  Both he and his wife are concerned.  Libby Maw, MD  9/26 addendum: Radiologist suggest the possibility of inflammatory arthritis.  We will asked patient to return for a CBC/ESR/CRP. Joint was not inflamed and was non tender.   Elevated Psa. Will recheck with above.

## 2021-08-16 NOTE — Addendum Note (Signed)
Addended by: Abelino Derrick A on: 08/16/2021 01:02 PM   Modules accepted: Orders

## 2021-08-16 NOTE — Progress Notes (Signed)
Inflammatory changes seen in finger joint of concern. Have ordered additional blood work. Patient will need to have more blood work.

## 2021-08-16 NOTE — Progress Notes (Signed)
Radiologist suggested the possibility of an inflammatory arthritis. Have ordered additional blood work. Please return to the clinic to have it drawn.

## 2021-08-30 ENCOUNTER — Other Ambulatory Visit (INDEPENDENT_AMBULATORY_CARE_PROVIDER_SITE_OTHER): Payer: 59

## 2021-08-30 ENCOUNTER — Other Ambulatory Visit: Payer: Self-pay

## 2021-08-30 DIAGNOSIS — Z87898 Personal history of other specified conditions: Secondary | ICD-10-CM

## 2021-08-30 DIAGNOSIS — M79645 Pain in left finger(s): Secondary | ICD-10-CM

## 2021-08-30 LAB — CBC WITH DIFFERENTIAL/PLATELET
Basophils Absolute: 0 10*3/uL (ref 0.0–0.1)
Basophils Relative: 0.8 % (ref 0.0–3.0)
Eosinophils Absolute: 0.2 10*3/uL (ref 0.0–0.7)
Eosinophils Relative: 4.9 % (ref 0.0–5.0)
HCT: 41.3 % (ref 39.0–52.0)
Hemoglobin: 13.2 g/dL (ref 13.0–17.0)
Lymphocytes Relative: 52.5 % — ABNORMAL HIGH (ref 12.0–46.0)
Lymphs Abs: 2.2 10*3/uL (ref 0.7–4.0)
MCHC: 31.9 g/dL (ref 30.0–36.0)
MCV: 74.3 fl — ABNORMAL LOW (ref 78.0–100.0)
Monocytes Absolute: 0.3 10*3/uL (ref 0.1–1.0)
Monocytes Relative: 6.7 % (ref 3.0–12.0)
Neutro Abs: 1.5 10*3/uL (ref 1.4–7.7)
Neutrophils Relative %: 35.1 % — ABNORMAL LOW (ref 43.0–77.0)
Platelets: 227 10*3/uL (ref 150.0–400.0)
RBC: 5.56 Mil/uL (ref 4.22–5.81)
RDW: 14.7 % (ref 11.5–15.5)
WBC: 4.2 10*3/uL (ref 4.0–10.5)

## 2021-08-30 LAB — C-REACTIVE PROTEIN: CRP: <1 mg/dL (ref 0.5–20.0)

## 2021-08-30 LAB — SEDIMENTATION RATE: Sed Rate: 15 mm/hr (ref 0–20)

## 2021-08-30 LAB — PSA: PSA: 5.56 ng/mL — ABNORMAL HIGH (ref 0.10–4.00)

## 2021-08-31 NOTE — Addendum Note (Signed)
Addended by: Jon Billings on: 08/31/2021 12:38 PM   Modules accepted: Orders

## 2021-09-01 ENCOUNTER — Other Ambulatory Visit: Payer: 59

## 2021-10-13 ENCOUNTER — Ambulatory Visit: Payer: 59 | Admitting: Family Medicine

## 2021-11-26 ENCOUNTER — Ambulatory Visit: Payer: 59 | Admitting: Family Medicine

## 2021-12-27 ENCOUNTER — Ambulatory Visit: Payer: Self-pay | Admitting: Family Medicine

## 2022-01-10 ENCOUNTER — Encounter: Payer: Self-pay | Admitting: Family Medicine

## 2022-01-10 ENCOUNTER — Other Ambulatory Visit: Payer: Self-pay

## 2022-01-10 ENCOUNTER — Ambulatory Visit (INDEPENDENT_AMBULATORY_CARE_PROVIDER_SITE_OTHER): Payer: Managed Care, Other (non HMO) | Admitting: Family Medicine

## 2022-01-10 VITALS — BP 146/84 | HR 67 | Temp 97.4°F | Ht 71.0 in | Wt 184.2 lb

## 2022-01-10 DIAGNOSIS — R972 Elevated prostate specific antigen [PSA]: Secondary | ICD-10-CM | POA: Diagnosis not present

## 2022-01-10 DIAGNOSIS — I10 Essential (primary) hypertension: Secondary | ICD-10-CM

## 2022-01-10 DIAGNOSIS — M25562 Pain in left knee: Secondary | ICD-10-CM

## 2022-01-10 MED ORDER — AMLODIPINE BESYLATE 5 MG PO TABS
5.0000 mg | ORAL_TABLET | Freq: Every day | ORAL | 0 refills | Status: AC
Start: 2022-01-10 — End: ?
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
## 2022-04-12 ENCOUNTER — Ambulatory Visit: Payer: Managed Care, Other (non HMO) | Admitting: Family Medicine
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
